# Patient Record
Sex: Female | Born: 1990 | Race: White | Hispanic: No | Marital: Married | Smoking: Never smoker
Health system: Southern US, Academic
[De-identification: ages and names within clinical notes are randomized; demographics above are authoritative.]

## PROBLEM LIST (undated history)

## (undated) DIAGNOSIS — E039 Hypothyroidism, unspecified: Secondary | ICD-10-CM

## (undated) DIAGNOSIS — I10 Essential (primary) hypertension: Secondary | ICD-10-CM

## (undated) DIAGNOSIS — F419 Anxiety disorder, unspecified: Secondary | ICD-10-CM

## (undated) HISTORY — DX: Essential (primary) hypertension: I10

## (undated) HISTORY — DX: Anxiety disorder, unspecified: F41.9

## (undated) HISTORY — PX: HX WISDOM TEETH EXTRACTION: SHX21

## (undated) HISTORY — DX: Hypothyroidism, unspecified: E03.9

---
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
## 2014-05-10 ENCOUNTER — Encounter: Payer: Self-pay | Admitting: Physician Assistant

## 2014-05-10 ENCOUNTER — Ambulatory Visit (INDEPENDENT_AMBULATORY_CARE_PROVIDER_SITE_OTHER): Payer: Managed Care, Other (non HMO) | Admitting: Physician Assistant

## 2014-05-10 VITALS — BP 130/82 | HR 65 | Ht 59.0 in | Wt 105.0 lb

## 2014-05-10 DIAGNOSIS — E039 Hypothyroidism, unspecified: Secondary | ICD-10-CM

## 2014-05-10 DIAGNOSIS — F411 Generalized anxiety disorder: Secondary | ICD-10-CM

## 2014-05-10 DIAGNOSIS — R002 Palpitations: Secondary | ICD-10-CM | POA: Diagnosis not present

## 2014-05-10 DIAGNOSIS — F410 Panic disorder [episodic paroxysmal anxiety] without agoraphobia: Secondary | ICD-10-CM

## 2014-05-10 MED ORDER — LEVOTHYROXINE SODIUM 88 MCG PO TABS: 88.0000 ug | ORAL_TABLET | Freq: Every day | ORAL | Status: DC

## 2014-05-10 MED ORDER — CITALOPRAM HYDROBROMIDE 10 MG PO TABS: 10.0000 mg | ORAL_TABLET | Freq: Every day | ORAL | Status: DC

## 2014-05-10 NOTE — Progress Notes (Signed)
   Subjective:    Patient ID: Heidi Levy, female    DOB: 1991-03-04, 24 y.o.   MRN: [id number]  HPI  Patient is a 24 year old female who presents to the clinic to establish care.  She has a past medical history of hypothyroidism. She has not been on her medication in the last couple weeks. She needs to be restarted.  [name]..No family history on file. .. History   Social History  . Marital Status: Single    Spouse Name: N/A  . Number of Children: N/A  . Years of Education: N/A   Occupational History  . Not on file.   Social History Main Topics  . Smoking status: Never Smoker   . Smokeless tobacco: Not on file  . Alcohol Use: Not on file  . Drug Use: Not on file  . Sexual Activity: Not on file   Other Topics Concern  . Not on file   Social History Narrative  . No narrative on file   Patient does feel very anxious today. She also feels like her anxiety has been increasing since December. She does not note anything that has triggered her anxiety. She's had some of her first panic attacks in the last month. She will experience heart palpitations and weird sensations in her left arm and neck. Her whole body gets tenths. She has been given Xanax to use as needed which does help. She feels like panic attacks are becoming more often and severe.    Review of Systems  All other systems reviewed and are negative.      Objective:   Physical Exam  Constitutional: She is oriented to person, place, and time. She appears well-developed and well-nourished.  HENT:  Head: Normocephalic and atraumatic.  Cardiovascular: Normal rate, regular rhythm and normal heart sounds.   Pulmonary/Chest: Effort normal and breath sounds normal. She has no wheezes.  Neurological: She is alert and oriented to person, place, and time.  Skin: Skin is dry.  Psychiatric:  Very anxious in the exam room. Her face and chest were very red and blotchy. She broke down and cried at one point about her symptoms.           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 Patient currently has Xanax. Discussed abuse potential and to only use as needed. I did go ahead and add Celexa 10 mg at bedtime. Discuss potential side effects and to call if any worsening anxiety or depression. Patient was certainly encouraged to consider counseling and exercise to help control anxiety symptoms.   Palpitations- EKG NSR at 81. No acute changes. Discuss with patient I do not think palpitations are cardiac related. I really think take are due to either increased anxiety or potentially thyroid levels being off.  Hypothyroidism-restarted medication will check in 4-6 weeks.

## 2014-05-10 NOTE — Patient Instructions (Signed)
Generalized Anxiety Disorder Generalized anxiety disorder (GAD) is a mental disorder. It interferes with life functions, including relationships, work, and school. GAD is different from normal anxiety, which everyone experiences at some point in their lives in response to specific life events and activities. Normal anxiety actually helps us prepare for and get through these life events and activities. Normal anxiety goes away after the event or activity is over.  GAD causes anxiety that is not necessarily related to specific events or activities. It also causes excess anxiety in proportion to specific events or activities. The anxiety associated with GAD is also difficult to control. GAD can vary from mild to severe. People with severe GAD can have intense waves of anxiety with physical symptoms (panic attacks).  SYMPTOMS The anxiety and worry associated with GAD are difficult to control. This anxiety and worry are related to many life events and activities and also occur more days than not for 6 months or longer. People with GAD also have three or more of the following symptoms (one or more in children):  Restlessness.   Fatigue.  Difficulty concentrating.   Irritability.  Muscle tension.  Difficulty sleeping or unsatisfying sleep. DIAGNOSIS GAD is diagnosed through an assessment by your health care provider. Your health care provider will ask you questions aboutyour mood,physical symptoms, and events in your life. Your health care provider may ask you about your medical history and use of alcohol or drugs, including prescription medicines. Your health care provider may also do a physical exam and blood tests. Certain medical conditions and the use of certain substances can cause symptoms similar to those associated with GAD. Your health care provider may refer you to a mental health specialist for further evaluation. TREATMENT The following therapies are usually used to treat GAD:    Medication. Antidepressant medication usually is prescribed for long-term daily control. Antianxiety medicines may be added in severe cases, especially when panic attacks occur.   Talk therapy (psychotherapy). Certain types of talk therapy can be helpful in treating GAD by providing support, education, and guidance. A form of talk therapy called cognitive behavioral therapy can teach you healthy ways to think about and react to daily life events and activities.  Stress managementtechniques. These include yoga, meditation, and exercise and can be very helpful when they are practiced regularly. A mental health specialist can help determine which treatment is best for you. Some people see improvement with one therapy. However, other people require a combination of therapies. Document Released: 06/26/2012 Document Revised: 07/16/2013 Document Reviewed: 06/26/2012 Exit[employer] Patient Information 2015 [employer]. This information is not intended to replace advice given to you by your health care provider. Make sure you discuss any questions you have with your health care provider.  

## 2014-05-13 ENCOUNTER — Encounter: Payer: Self-pay | Admitting: *Deleted

## 2014-05-17 ENCOUNTER — Telehealth: Payer: Self-pay | Admitting: *Deleted

## 2014-05-17 DIAGNOSIS — R002 Palpitations: Secondary | ICD-10-CM

## 2014-05-17 NOTE — Telephone Encounter (Signed)
EKG order placed for the 2/26 appt.

## 2014-06-14 ENCOUNTER — Ambulatory Visit: Payer: Managed Care, Other (non HMO) | Admitting: Physician Assistant

## 2014-06-17 ENCOUNTER — Ambulatory Visit (INDEPENDENT_AMBULATORY_CARE_PROVIDER_SITE_OTHER): Payer: Managed Care, Other (non HMO) | Admitting: Physician Assistant

## 2014-06-17 ENCOUNTER — Encounter: Payer: Self-pay | Admitting: Physician Assistant

## 2014-06-17 VITALS — BP 140/90 | HR 84 | Wt 103.0 lb

## 2014-06-17 DIAGNOSIS — Z79899 Other long term (current) drug therapy: Secondary | ICD-10-CM

## 2014-06-17 DIAGNOSIS — R002 Palpitations: Secondary | ICD-10-CM | POA: Diagnosis not present

## 2014-06-17 DIAGNOSIS — E039 Hypothyroidism, unspecified: Secondary | ICD-10-CM

## 2014-06-17 DIAGNOSIS — F411 Generalized anxiety disorder: Secondary | ICD-10-CM

## 2014-06-17 DIAGNOSIS — F410 Panic disorder [episodic paroxysmal anxiety] without agoraphobia: Secondary | ICD-10-CM | POA: Diagnosis not present

## 2014-06-17 DIAGNOSIS — IMO0001 Reserved for inherently not codable concepts without codable children: Secondary | ICD-10-CM

## 2014-06-17 DIAGNOSIS — R030 Elevated blood-pressure reading, without diagnosis of hypertension: Secondary | ICD-10-CM

## 2014-06-17 MED ORDER — CITALOPRAM HYDROBROMIDE 10 MG PO TABS
10.0000 mg | ORAL_TABLET | Freq: Every day | ORAL | Status: DC
Start: 2014-06-17 — End: 2014-07-12

## 2014-06-18 ENCOUNTER — Other Ambulatory Visit: Payer: Self-pay | Admitting: Physician Assistant

## 2014-06-18 LAB — COMPLETE METABOLIC PANEL WITH GFR
ALBUMIN: 4.9 g/dL (ref 3.5–5.2)
ALK PHOS: 36 U/L — AB (ref 39–117)
ALT: 19 U/L (ref 0–35)
AST: 20 U/L (ref 0–37)
BUN: 12 mg/dL (ref 6–23)
CHLORIDE: 103 meq/L (ref 96–112)
CO2: 25 mEq/L (ref 19–32)
Calcium: 9.4 mg/dL (ref 8.4–10.5)
Creat: 0.72 mg/dL (ref 0.50–1.10)
GFR, Est African American: >89 mL/min
GLUCOSE: 89 mg/dL (ref 70–99)
Potassium: 3.9 mEq/L (ref 3.5–5.3)
SODIUM: 138 meq/L (ref 135–145)
TOTAL PROTEIN: 7.4 g/dL (ref 6.0–8.3)
Total Bilirubin: 1.6 mg/dL — ABNORMAL HIGH (ref 0.2–1.2)

## 2014-06-18 LAB — TSH: TSH: 6.508 u[IU]/mL — ABNORMAL HIGH (ref 0.350–4.500)

## 2014-06-18 MED ORDER — LEVOTHYROXINE SODIUM 100 MCG PO TABS: 100.0000 ug | ORAL_TABLET | Freq: Every day | ORAL | Status: DC

## 2014-06-19 DIAGNOSIS — IMO0001 Reserved for inherently not codable concepts without codable children: Secondary | ICD-10-CM | POA: Insufficient documentation

## 2014-06-19 DIAGNOSIS — R030 Elevated blood-pressure reading, without diagnosis of hypertension: Secondary | ICD-10-CM

## 2014-06-19 NOTE — Progress Notes (Signed)
   Subjective:    Patient ID: Heidi Levy, female    DOB: January 22, 1991, 24 y.o.   MRN: [id number]  HPI Patient presents to the clinic for follow-up on Celexa. She reports her anxiety is doing much better. She has not had any panic attacks in the last month. She certainly still has times or she feels anxious certain social situations does put her at a heightened anxiety. Her blood pressure has been elevated up multiple visits. She really feels like this is due to her anxiety. She admits she has not checked her blood pressure outside of work. She denies any chest pains, palpitations, headaches or vision changes. She is exercising fairly regularly. She has not had to use her Xanax in the last month.  Patient is hypothyroid. She has not had her labs checked in a while. She will be needing a refill soon.   Review of Systems  All other systems reviewed and are negative.      Objective:   Physical Exam  Constitutional: She is oriented to person, place, and time. She appears well-developed and well-nourished.  HENT:  Head: Normocephalic and atraumatic.  Cardiovascular: Normal rate, regular rhythm and normal heart sounds.   Pulmonary/Chest: Effort normal and breath sounds normal.  Neurological: She is alert and oriented to person, place, and time.  Skin: Skin is dry.  Psychiatric: She has a normal mood and affect. Her behavior is normal.          Assessment & Plan:  GAD/palptiations/panic attacks- GAD-7 was 7. Certainly decreased from last visit. Patient is at a rather low-dose of Celexa discussed increased. Patient does not want to increase today. She will try to improve anxiety with regular exercise and behavioral techniques. She has not used any Xanax since last visit. She does not need a refill today. Refilled Celexa for 6 months.  Elevated blood pressure-discuss elevated blood pressure with patient today. It has been high at every visit. We have contributed this to anxiety. Patient  admits she has not checked her blood pressure outside of the office. I advised her to start checking her blood pressure outside of the office and give me readings. If blood pressure continues to be elevated we will consider medication. Discuss other causes of elevated blood pressure such as high salt intake. Encouraged her to decrease her salt.   Hypothyroidism- will check labs and adjust accordingly.

## 2014-06-20 ENCOUNTER — Telehealth: Payer: Self-pay | Admitting: *Deleted

## 2014-06-20 ENCOUNTER — Other Ambulatory Visit: Payer: Self-pay | Admitting: Physician Assistant

## 2014-06-20 DIAGNOSIS — I10 Essential (primary) hypertension: Secondary | ICD-10-CM

## 2014-06-20 MED ORDER — LISINOPRIL 10 MG PO TABS
10.0000 mg | ORAL_TABLET | Freq: Every day | ORAL | Status: DC
Start: 2014-06-20 — End: 2014-07-12

## 2014-06-20 NOTE — Telephone Encounter (Signed)
Patient call  stating that she was told to monitor her blood pressure's  and that they are staying in the high 140's / 150's over mid 80's.  Just letting you know so can continue with the next step.

## 2014-06-20 NOTE — Telephone Encounter (Signed)
Sent lisinopril 10mg  daily to pharmacy. Recheck BP in 3-4 weeks in office to make sure BP more controlled.

## 2014-07-12 ENCOUNTER — Encounter: Payer: Self-pay | Admitting: Physician Assistant

## 2014-07-12 ENCOUNTER — Ambulatory Visit (INDEPENDENT_AMBULATORY_CARE_PROVIDER_SITE_OTHER): Payer: Managed Care, Other (non HMO) | Admitting: Physician Assistant

## 2014-07-12 VITALS — BP 127/76 | HR 93 | Ht 59.0 in | Wt 104.0 lb

## 2014-07-12 DIAGNOSIS — I10 Essential (primary) hypertension: Secondary | ICD-10-CM | POA: Diagnosis not present

## 2014-07-12 DIAGNOSIS — R002 Palpitations: Secondary | ICD-10-CM | POA: Diagnosis not present

## 2014-07-12 DIAGNOSIS — E039 Hypothyroidism, unspecified: Secondary | ICD-10-CM

## 2014-07-12 DIAGNOSIS — F411 Generalized anxiety disorder: Secondary | ICD-10-CM

## 2014-07-12 DIAGNOSIS — F410 Panic disorder [episodic paroxysmal anxiety] without agoraphobia: Secondary | ICD-10-CM

## 2014-07-12 MED ORDER — LISINOPRIL 10 MG PO TABS: 10.0000 mg | ORAL_TABLET | Freq: Every day | ORAL | Status: DC

## 2014-07-12 MED ORDER — CITALOPRAM HYDROBROMIDE 20 MG PO TABS
20.0000 mg | ORAL_TABLET | Freq: Every day | ORAL | Status: DC
Start: 2014-07-12 — End: 2014-12-17

## 2014-07-12 NOTE — Patient Instructions (Signed)

## 2014-07-14 NOTE — Progress Notes (Signed)
   Subjective:    Patient ID: Heidi Levy, female    DOB: Nov 01, 1990, 24 y.o.   MRN: [id number]  HPI Pt presents to the clinic for follow up.   Hypothyroidism- she is on medication. Needs rechecked to make sure in normal range.  GAD/panic attacks- she was doing great but has felt some anxiety creep in and felt times of extreme anxiety. Not had to use xanax is a while.   HTN- no CP, palpitations, headaches, vision changes. Taking lisinopril regularly.    Review of Systems  All other systems reviewed and are negative.      Objective:   Physical Exam  Constitutional: She is oriented to person, place, and time. She appears well-developed and well-nourished.  HENT:  Head: Normocephalic and atraumatic.  Cardiovascular: Normal rate, regular rhythm and normal heart sounds.   Pulmonary/Chest: Effort normal and breath sounds normal. She has no wheezes.  Neurological: She is alert and oriented to person, place, and time.  Skin: Skin is dry.  Psychiatric: She has a normal mood and affect. Her behavior is normal.          Assessment & Plan:  Hypothyroidism- will check TSH and adjust accordingly.   HTN- controlled today. Will leave on lisinipril 10mg . If elevation due to anxiety may start to decrease. Keep monitoring if having symptoms of hypotension which was discussed in office. Follow up in 6 months.   GAD/panic attacks- GAD-7 was 9. Increased celexa to 20mg . Follow up in 6 months. Sooner if needed.

## 2014-09-11 ENCOUNTER — Telehealth: Payer: Self-pay

## 2014-09-11 NOTE — Telephone Encounter (Signed)
Patient called and left a message on nurse line asking for a return call.   Returned Call: Left message asking patient to call back.  

## 2014-09-18 ENCOUNTER — Other Ambulatory Visit: Payer: Self-pay | Admitting: Physician Assistant

## 2014-09-23 ENCOUNTER — Ambulatory Visit: Payer: Managed Care, Other (non HMO) | Admitting: Family Medicine

## 2014-11-01 ENCOUNTER — Ambulatory Visit: Payer: Managed Care, Other (non HMO) | Admitting: Physician Assistant

## 2014-11-08 ENCOUNTER — Encounter: Payer: Self-pay | Admitting: Physician Assistant

## 2014-11-08 ENCOUNTER — Ambulatory Visit (INDEPENDENT_AMBULATORY_CARE_PROVIDER_SITE_OTHER): Payer: Managed Care, Other (non HMO) | Admitting: Physician Assistant

## 2014-11-08 VITALS — BP 129/72 | HR 87 | Ht 59.0 in | Wt 100.0 lb

## 2014-11-08 DIAGNOSIS — I10 Essential (primary) hypertension: Secondary | ICD-10-CM | POA: Diagnosis not present

## 2014-11-08 DIAGNOSIS — F411 Generalized anxiety disorder: Secondary | ICD-10-CM

## 2014-11-08 DIAGNOSIS — E039 Hypothyroidism, unspecified: Secondary | ICD-10-CM

## 2014-11-08 MED ORDER — VILAZODONE HCL 40 MG PO TABS: 40.0000 mg | ORAL_TABLET | Freq: Every day | ORAL | Status: DC

## 2014-11-08 NOTE — Patient Instructions (Signed)
1/2 celexa and 1/2 viibryd for 7 days then stop celexa and start 1 full tablet of viibryd.

## 2014-11-08 NOTE — Progress Notes (Signed)
   Subjective:    Patient ID: Heidi Levy, female    DOB: 03-06-1991, 24 y.o.   MRN: [id number]  HPI Pt presents to the clinic for 6 month follow up.   Hypothyroidism- on levothyroxine daily. Needs labs rechecked. No concerns.   GAD-on celexa  daily. Not doing well. orginally had good response. Now anxiety is bad. She worries about everything. No suicidal or homicidal thoughts. No new events to cause anxiety.   HTN- no CP/SOB. Occasional heart racing with anxiety. Doing well on lisinopril.    Review of Systems  All other systems reviewed and are negative.      Objective:   Physical Exam  Constitutional: She is oriented to person, place, and time. She appears well-developed and well-nourished.  HENT:  Head: Normocephalic and atraumatic.  Neck: Normal range of motion. Neck supple. No thyromegaly present.  Cardiovascular: Normal rate, regular rhythm and normal heart sounds.   Pulmonary/Chest: Effort normal and breath sounds normal.  Neurological: She is alert and oriented to person, place, and time.  Skin: Skin is dry.  Psychiatric: Her behavior is normal.  Seemed a little anxious at visit.          Assessment & Plan:  Hypothyroidism- recheck TSH and adjust medication accordingly.   GAD- GAD-7 was 15. PHQ-9 was 5. Will taper off celexa and start viibryd  daily. Coupon given to make cheaper. Discussed side effects. Follow up in 4-6 weeks.   HTN- continue on lisinopril.

## 2014-11-09 LAB — TSH: TSH: 0.811 u[IU]/mL (ref 0.350–4.500)

## 2014-11-12 ENCOUNTER — Other Ambulatory Visit: Payer: Self-pay | Admitting: *Deleted

## 2014-11-12 MED ORDER — LEVOTHYROXINE SODIUM 88 MCG PO TABS: 88.0000 ug | ORAL_TABLET | Freq: Every day | ORAL | Status: DC

## 2014-12-17 ENCOUNTER — Encounter: Payer: Self-pay | Admitting: Physician Assistant

## 2014-12-17 ENCOUNTER — Ambulatory Visit (INDEPENDENT_AMBULATORY_CARE_PROVIDER_SITE_OTHER): Payer: Managed Care, Other (non HMO) | Admitting: Physician Assistant

## 2014-12-17 VITALS — BP 118/76 | HR 92 | Ht 59.0 in | Wt 104.0 lb

## 2014-12-17 DIAGNOSIS — F411 Generalized anxiety disorder: Secondary | ICD-10-CM | POA: Diagnosis not present

## 2014-12-17 DIAGNOSIS — I10 Essential (primary) hypertension: Secondary | ICD-10-CM | POA: Diagnosis not present

## 2014-12-17 DIAGNOSIS — R002 Palpitations: Secondary | ICD-10-CM | POA: Diagnosis not present

## 2014-12-17 DIAGNOSIS — E039 Hypothyroidism, unspecified: Secondary | ICD-10-CM | POA: Diagnosis not present

## 2014-12-17 DIAGNOSIS — F410 Panic disorder [episodic paroxysmal anxiety] without agoraphobia: Secondary | ICD-10-CM

## 2014-12-17 LAB — TSH: TSH: 18.566 u[IU]/mL — ABNORMAL HIGH (ref 0.350–4.500)

## 2014-12-17 MED ORDER — BUSPIRONE HCL 5 MG PO TABS: 5.0000 mg | ORAL_TABLET | Freq: Three times a day (TID) | ORAL | Status: DC

## 2014-12-17 MED ORDER — ESCITALOPRAM OXALATE 10 MG PO TABS: 10.0000 mg | ORAL_TABLET | Freq: Every day | ORAL | Status: DC

## 2014-12-17 NOTE — Progress Notes (Signed)
   Subjective:    Patient ID: Heidi Levy, female    DOB: 07-25-1990, 24 y.o.   MRN: [id number]  HPI Patient is a 24 year old female who presents to the clinic to follow-up on anxiety. She was seen approximately 5 weeks ago and we had started her on Viibryd. This will switch from Celexa. She did not do well on viibryd. She complained of increased anxiety, increased insomnia, and night sweats. She took her last dose yesterday. She certainly has anxiety all day long. She has trouble relaxing and easily irritated. She denies any thoughts of suicide or hurting others. She feels like her anxiety increases because she will occasionally get chest pain and chest tightness with her anxiety. She does not like to take than a but will if she has to.  Patient also has some ongoing thyroid issues. We decreased her levothyroxin at last visit and need to recheck today.   Review of Systems  All other systems reviewed and are negative.      Objective:   Physical Exam  Constitutional: She is oriented to person, place, and time. She appears well-developed and well-nourished.  HENT:  Head: Normocephalic and atraumatic.  Cardiovascular: Normal rate, regular rhythm and normal heart sounds.   Pulmonary/Chest: Effort normal and breath sounds normal.  Neurological: She is alert and oriented to person, place, and time.  Psychiatric: She has a normal mood and affect. Her behavior is normal.          Assessment & Plan:  GAD- GAD-7 was 10. PHQ-9 was 5. After speaking with patient patient seems to have the best results with Celexa but was not working once she had been on for a while. I would like to try Lexapro 10 mg today and use BuSpar 5 mg up to 3 times a day for 10, anxiety relief. Will follow-up in the next 4-6 weeks. If she feels like her anxiety is worsening or change in shoe may follow-up sooner.  Hypothyroidism- will check levels today and adjust accordingly.   HTN- controlled today.

## 2014-12-18 ENCOUNTER — Other Ambulatory Visit: Payer: Self-pay | Admitting: Physician Assistant

## 2014-12-18 MED ORDER — LEVOTHYROXINE SODIUM 100 MCG PO TABS: 100.0000 ug | ORAL_TABLET | Freq: Every day | ORAL | Status: DC

## 2015-01-06 ENCOUNTER — Telehealth: Payer: Self-pay | Admitting: *Deleted

## 2015-01-06 NOTE — Telephone Encounter (Signed)
Pt left vm asking for a referral to Endocrinology since her thyroid levels keep fluctuating so much & her anxiety is so high.

## 2015-01-07 ENCOUNTER — Other Ambulatory Visit: Payer: Self-pay | Admitting: Physician Assistant

## 2015-01-07 DIAGNOSIS — E039 Hypothyroidism, unspecified: Secondary | ICD-10-CM

## 2015-01-07 NOTE — Progress Notes (Signed)
Sent referral 

## 2015-01-07 NOTE — Telephone Encounter (Signed)
Pt notified of referral  

## 2015-01-07 NOTE — Telephone Encounter (Signed)
Sent referral 

## 2015-01-10 ENCOUNTER — Ambulatory Visit: Payer: Managed Care, Other (non HMO) | Admitting: Physician Assistant

## 2015-01-15 ENCOUNTER — Encounter: Payer: Self-pay | Admitting: Endocrinology

## 2015-01-15 ENCOUNTER — Ambulatory Visit (INDEPENDENT_AMBULATORY_CARE_PROVIDER_SITE_OTHER): Payer: Managed Care, Other (non HMO) | Admitting: Endocrinology

## 2015-01-15 VITALS — BP 112/64 | HR 83 | Temp 98.3°F | Ht 59.0 in | Wt 102.0 lb

## 2015-01-15 DIAGNOSIS — E039 Hypothyroidism, unspecified: Secondary | ICD-10-CM | POA: Insufficient documentation

## 2015-01-15 DIAGNOSIS — Z0289 Encounter for other administrative examinations: Secondary | ICD-10-CM

## 2015-01-15 LAB — TSH: TSH: 0.65 u[IU]/mL (ref 0.35–4.50)

## 2015-01-15 NOTE — Progress Notes (Signed)
Subjective:    Patient ID: Heidi Levy, female    DOB: 1990/05/31, 24 y.o.   MRN: [id number]  HPI Pt reports hypothyroidism was dx'ed at age 89.  she has been on prescribed thyroid hormone therapy.  she has never taken kelp or any other type of non-prescribed thyroid product.  she has never had thyroid imaging.  She is not considering a pregnancy.  she has never had thyroid surgery, or XRT to the neck.  He has never been on amiodarone or lithium.  She has slight cold intolerance throughout the body, and assoc anxiety.  She says her dosage requirement has varied from 88-125 mcg/d.  She says TFT have varied widely with minimal adjustments in the dosage.  She has been on 100 mcg/d x a few weeks.  She takes it with water only, and fasting.    Past Medical History  Diagnosis Date  . Hypothyroidism     Past Surgical History  Procedure Laterality Date  . Wisdom tooth extraction  May 2009    Social History   Social History  . Marital Status: Single    Spouse Name: N/A  . Number of Children: N/A  . Years of Education: N/A   Occupational History  . Not on file.   Social History Main Topics  . Smoking status: Never Smoker   . Smokeless tobacco: Not on file  . Alcohol Use: 1.2 - 1.8 oz/week    2-3 Standard drinks or equivalent per week  . Drug Use: No  . Sexual Activity: Yes    Birth Control/ Protection: Pill   Other Topics Concern  . Not on file   Social History Narrative    Current Outpatient Prescriptions on File Prior to Visit  Medication Sig Dispense Refill  . ALPRAZolam (XANAX) 0.5 MG tablet Take 0.5 mg by mouth 2 (two) times daily as needed for anxiety.    . busPIRone (BUSPAR) 5 MG tablet Take 1 tablet (5 mg total) by mouth 3 (three) times daily. 90 tablet 0  . escitalopram (LEXAPRO) 10 MG tablet Take 1 tablet (10 mg total) by mouth daily. 30 tablet 2  . levothyroxine (SYNTHROID, LEVOTHROID) 100 MCG tablet Take 1 tablet (100 mcg total) by mouth daily. 30 tablet 1  .  lisinopril (PRINIVIL,ZESTRIL) 10 MG tablet Take 1 tablet (10 mg total) by mouth daily. 30 tablet 5  . loratadine (CLARITIN) 10 MG tablet Take 10 mg by mouth every other day.    . Norethindrone-Eth Estradiol (ALYACEN 1/35 PO) Take by mouth daily.     No current facility-administered medications on file prior to visit.    Allergies  Allergen Reactions  . Benzoyl Peroxide     Family History  Problem Relation Age of Onset  . Diabetes Father   . Hyperlipidemia Father   . Hypertension Father   . Hypertension Mother   . Stroke Father   . Thyroid disease Father     BP 112/64 mmHg  Pulse 83  Temp(Src) 98.3 F (36.8 C) (Oral)  Ht 4\' 11"  (1.499 m)  Wt 102 lb (46.267 kg)  BMI 20.59 kg/m2  SpO2 97%  Review of Systems denies insomnia, hair loss, muscle cramps, sob, weight gain, constipation, numbness, blurry vision, myalgias, rhinorrhea, easy bruising, and syncope.  She has monthly bleeding on oral contraceptive.  She has dry skin.      Objective:   Physical Exam VS: see vs page GEN: no distress HEAD: head: no deformity eyes: no periorbital swelling, no proptosis external  nose and ears are normal mouth: no lesion seen NECK: supple, thyroid is not enlarged CHEST WALL: no deformity LUNGS:  Clear to auscultation CV: reg rate and rhythm, no murmur. ABD: abdomen is soft, nontender.  no hepatosplenomegaly.  not distended.  no hernia.   MUSCULOSKELETAL: muscle bulk and strength are grossly normal.  no obvious joint swelling.  gait is normal and steady EXTEMITIES: no deformity.  no edema PULSES: dorsalis pedis intact bilat.   NEURO:  cn 2-12 grossly intact.   readily moves all 4's.  sensation is intact to touch on all 4's.   SKIN:  Normal texture and temperature.  No rash or suspicious lesion is visible.   NODES:  None palpable at the neck PSYCH: alert, well-oriented.  Does not appear anxious nor depressed.  Lab Results  Component Value Date   TSH 0.65 01/15/2015   I have  reviewed outside records, and summarized: Pt was noted to have labile TSH, and referred here.     Assessment & Plan:  Hypothyroidism: well-replaced.  However, we'll continue to follow, as TSH has been labile. 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    Patient is advised the following: Patient Instructions  blood tests are requested for you today.  We'll let you know about the results. If at all possible, we'll try to stay at 100 for now. I would be happy to see you back here as necessary In view of your medical condition, you should avoid pregnancy until we have decided it is safe.       Levothyroxine injection What is this medicine? LEVOTHYROXINE (lee voe thye ROX een) is a thyroid hormone. This medicine can improve symptoms of thyroid deficiency such as slow speech, lack of energy, weight gain, hair loss, dry skin, and feeling cold. It also helps to treat goiter (an enlarged thyroid gland). This medicine may be used for other purposes; ask your health care provider or pharmacist if you have questions. What should I tell my health care provider before I take this medicine? They need to know if you have any of these conditions: -angina -diabetes -dieting or on a weight loss program -fertility problems -heart disease -high levels of thyroid hormone -pituitary gland problem -previous heart attack -an unusual or allergic reaction to levothyroxine, thyroid hormones, other medicines, foods, dyes, or preservatives -pregnant or trying to get pregnant -breast-feeding How should I use this medicine? This medicine is for injection into a muscle or into a vein. It is given by a health care professional in a hospital or clinic setting. Contact your pediatrician regarding the use of this medicine in children. While this drug may be prescribed for children and infants as young as a few days of age for selected conditions, precautions do  apply. Overdosage: If you think you have taken too much of this medicine contact a poison control center or emergency room at once. NOTE: This medicine is only for you. Do not share this medicine with others. What if I miss a dose? This does not apply. What may interact with this medicine? -amiodarone -carbamazepine -digoxin -female hormones, including contraceptive or birth control pills -ketamine -medicines for colds and breathing difficulties -medicines for diabetes -medicines for mental depression -medicines or herbals used to decrease weight or appetite -phenobarbital or other barbiturate medications -phenytoin -prednisone or other corticosteroids -rifabutin -rifampin -soy isoflavones -theophylline -warfarin This list may not describe all possible interactions. Give your health care provider a list of  all the medicines, herbs, non-prescription drugs, or dietary supplements you use. Also tell them if you smoke, drink alcohol, or use illegal drugs. Some items may interact with your medicine. What should I watch for while using this medicine? You will need regular exams and occasional blood tests to check the response to treatment. If you are receiving this medicine for an underactive thyroid, it may be several weeks before you notice an improvement. Check with your doctor or health care professional if your symptoms do not improve. It may be necessary for you to take this medicine for the rest of your life. Do not stop using this medicine unless your doctor or health care professional advises you to. This medicine can affect blood sugar levels. If you have diabetes, check your blood sugar as directed. You may lose some of your hair when you first start treatment. With time, this usually corrects itself. If you are going to have surgery, tell your doctor or health care professional that you are taking this medicine. What side effects may I notice from receiving this medicine? Side  effects that you should report to your doctor or health care professional as soon as possible: -difficulty breathing, wheezing, or shortness of breath -chest pain -excessive sweating or intolerance to heat -fast or irregular heartbeat -nervousness -skin rash or hives -swelling of ankles, feet, or legs -tremors Side effects that usually do not require medical attention (report to your doctor or health care professional if they continue or are bothersome): -changes in appetite -changes in menstrual periods -diarrhea -hair loss -headache -trouble sleeping -weight loss This list may not describe all possible side effects. Call your doctor for medical advice about side effects. You may report side effects to FDA at 1-800-FDA-1088. Where should I keep my medicine? This does not apply. This medicine will be given to you in a hospital or health clinic setting. You will not store this medicine at home. NOTE: This sheet is a summary. It may not cover all possible information. If you have questions about this medicine, talk to your doctor, pharmacist, or health care provider.    2016, [employer]. (2011-03-25 12:51:00)

## 2015-01-15 NOTE — Patient Instructions (Addendum)
blood tests are requested for you today.  We'll let you know about the results. If at all possible, we'll try to stay at 100 for now. I would be happy to see you back here as necessary In view of your medical condition, you should avoid pregnancy until we have decided it is safe.       Levothyroxine injection What is this medicine? LEVOTHYROXINE (lee voe thye ROX een) is a thyroid hormone. This medicine can improve symptoms of thyroid deficiency such as slow speech, lack of energy, weight gain, hair loss, dry skin, and feeling cold. It also helps to treat goiter (an enlarged thyroid gland). This medicine may be used for other purposes; ask your health care provider or pharmacist if you have questions. What should I tell my health care provider before I take this medicine? They need to know if you have any of these conditions: -angina -diabetes -dieting or on a weight loss program -fertility problems -heart disease -high levels of thyroid hormone -pituitary gland problem -previous heart attack -an unusual or allergic reaction to levothyroxine, thyroid hormones, other medicines, foods, dyes, or preservatives -pregnant or trying to get pregnant -breast-feeding How should I use this medicine? This medicine is for injection into a muscle or into a vein. It is given by a health care professional in a hospital or clinic setting. Contact your pediatrician regarding the use of this medicine in children. While this drug may be prescribed for children and infants as young as a few days of age for selected conditions, precautions do apply. Overdosage: If you think you have taken too much of this medicine contact a poison control center or emergency room at once. NOTE: This medicine is only for you. Do not share this medicine with others. What if I miss a dose? This does not apply. What may interact with this medicine? -amiodarone -carbamazepine -digoxin -female hormones, including  contraceptive or birth control pills -ketamine -medicines for colds and breathing difficulties -medicines for diabetes -medicines for mental depression -medicines or herbals used to decrease weight or appetite -phenobarbital or other barbiturate medications -phenytoin -prednisone or other corticosteroids -rifabutin -rifampin -soy isoflavones -theophylline -warfarin This list may not describe all possible interactions. Give your health care provider a list of all the medicines, herbs, non-prescription drugs, or dietary supplements you use. Also tell them if you smoke, drink alcohol, or use illegal drugs. Some items may interact with your medicine. What should I watch for while using this medicine? You will need regular exams and occasional blood tests to check the response to treatment. If you are receiving this medicine for an underactive thyroid, it may be several weeks before you notice an improvement. Check with your doctor or health care professional if your symptoms do not improve. It may be necessary for you to take this medicine for the rest of your life. Do not stop using this medicine unless your doctor or health care professional advises you to. This medicine can affect blood sugar levels. If you have diabetes, check your blood sugar as directed. You may lose some of your hair when you first start treatment. With time, this usually corrects itself. If you are going to have surgery, tell your doctor or health care professional that you are taking this medicine. What side effects may I notice from receiving this medicine? Side effects that you should report to your doctor or health care professional as soon as possible: -difficulty breathing, wheezing, or shortness of breath -chest pain -excessive sweating or  intolerance to heat -fast or irregular heartbeat -nervousness -skin rash or hives -swelling of ankles, feet, or legs -tremors Side effects that usually do not require  medical attention (report to your doctor or health care professional if they continue or are bothersome): -changes in appetite -changes in menstrual periods -diarrhea -hair loss -headache -trouble sleeping -weight loss This list may not describe all possible side effects. Call your doctor for medical advice about side effects. You may report side effects to FDA at 1-800-FDA-1088. Where should I keep my medicine? This does not apply. This medicine will be given to you in a hospital or health clinic setting. You will not store this medicine at home. NOTE: This sheet is a summary. It may not cover all possible information. If you have questions about this medicine, talk to your doctor, pharmacist, or health care provider.    2016, [employer] Standard. (2011-03-25 12:51:00)

## 2015-01-16 ENCOUNTER — Telehealth: Payer: Self-pay | Admitting: Endocrinology

## 2015-01-16 NOTE — Telephone Encounter (Signed)
please call patient: Please continue the same thyroid medication. However, i would be happy to recheck this if you want. Do you want to recheck in 1-2 months?

## 2015-01-17 NOTE — Telephone Encounter (Signed)
Left voicemail advising of note below. Requested call back from the patient to schedule lab work if she would like.

## 2015-01-18 ENCOUNTER — Other Ambulatory Visit: Payer: Self-pay | Admitting: Physician Assistant

## 2015-01-21 ENCOUNTER — Other Ambulatory Visit: Payer: Self-pay | Admitting: Physician Assistant

## 2015-02-04 ENCOUNTER — Other Ambulatory Visit: Payer: Self-pay | Admitting: Physician Assistant

## 2015-03-03 ENCOUNTER — Other Ambulatory Visit: Payer: Self-pay | Admitting: Physician Assistant

## 2015-03-11 ENCOUNTER — Other Ambulatory Visit: Payer: Self-pay | Admitting: Physician Assistant

## 2015-03-23 ENCOUNTER — Other Ambulatory Visit: Payer: Self-pay | Admitting: Physician Assistant

## 2015-03-25 ENCOUNTER — Encounter: Payer: Self-pay | Admitting: Physician Assistant

## 2015-04-16 ENCOUNTER — Other Ambulatory Visit: Payer: Self-pay | Admitting: Physician Assistant

## 2015-04-16 ENCOUNTER — Encounter: Payer: Self-pay | Admitting: Physician Assistant

## 2015-04-16 MED ORDER — ESCITALOPRAM OXALATE 20 MG PO TABS: 20.0000 mg | ORAL_TABLET | Freq: Every day | ORAL | Status: DC

## 2015-05-26 ENCOUNTER — Ambulatory Visit (INDEPENDENT_AMBULATORY_CARE_PROVIDER_SITE_OTHER): Payer: Managed Care, Other (non HMO) | Admitting: Physician Assistant

## 2015-05-26 ENCOUNTER — Encounter: Payer: Self-pay | Admitting: Physician Assistant

## 2015-05-26 VITALS — BP 122/73 | HR 82 | Ht 59.0 in | Wt 99.0 lb

## 2015-05-26 DIAGNOSIS — E039 Hypothyroidism, unspecified: Secondary | ICD-10-CM

## 2015-05-26 DIAGNOSIS — R300 Dysuria: Secondary | ICD-10-CM

## 2015-05-26 DIAGNOSIS — F410 Panic disorder [episodic paroxysmal anxiety] without agoraphobia: Secondary | ICD-10-CM

## 2015-05-26 DIAGNOSIS — R3915 Urgency of urination: Secondary | ICD-10-CM | POA: Diagnosis not present

## 2015-05-26 DIAGNOSIS — F411 Generalized anxiety disorder: Secondary | ICD-10-CM

## 2015-05-26 DIAGNOSIS — N3001 Acute cystitis with hematuria: Secondary | ICD-10-CM

## 2015-05-26 DIAGNOSIS — I10 Essential (primary) hypertension: Secondary | ICD-10-CM

## 2015-05-26 LAB — POCT URINALYSIS DIPSTICK
Bilirubin, UA: NEGATIVE
GLUCOSE UA: NEGATIVE
Ketones, UA: NEGATIVE
NITRITE UA: POSITIVE
PROTEIN UA: NEGATIVE
Spec Grav, UA: 1.025
UROBILINOGEN UA: 0.2
pH, UA: 7

## 2015-05-26 LAB — TSH: TSH: 0.83 m[IU]/L

## 2015-05-26 MED ORDER — CIPROFLOXACIN HCL 500 MG PO TABS: 500.0000 mg | ORAL_TABLET | Freq: Two times a day (BID) | ORAL | Status: DC

## 2015-05-26 NOTE — Progress Notes (Signed)
   Subjective:    Patient ID: Heidi Levy, female    DOB: May 02, 1990, 25 y.o.   MRN: [id number]  HPI Pt is a 25 yo female who presents to the clinic with dysuria, frequency, urgency with new low back pain for last week. No fever, chills, n/v/d. She has not tried anything to make better.   She does need levothyroxine refills. She went to endocrinology once but he released her back to PCP.   She is on lexapro daily. Rarely takes xanax. She feels "ok" but still struggles with daily anxious feelings. She is not having as many "attacks".     Review of Systems See HPI>     Objective:   Physical Exam  Constitutional: She is oriented to person, place, and time. She appears well-developed and well-nourished.  HENT:  Head: Normocephalic and atraumatic.  Eyes: Conjunctivae are normal.  Neck: Normal range of motion. Neck supple. No thyromegaly present.  Cardiovascular: Normal rate, regular rhythm and normal heart sounds.   Pulmonary/Chest: Effort normal and breath sounds normal.  No CVA tenderness.   Abdominal:  Tenderness over suprapubic area to palpation.   Neurological: She is alert and oriented to person, place, and time.  Psychiatric: She has a normal mood and affect. Her behavior is normal.          Assessment & Plan:  Acute cystitis- .. Results for orders placed or performed in visit on 05/26/15  POCT urinalysis dipstick  Result Value Ref Range   Color, UA yellow    Clarity, UA turbid    Glucose, UA neg    Bilirubin, UA neg    Ketones, UA neg    Spec Grav, UA 1.025    Blood, UA small    pH, UA 7.0    Protein, UA neg    Urobilinogen, UA 0.2    Nitrite, UA pos    Leukocytes, UA moderate (2+) (A) Negative   Will culture. Given cipro for 7 days due to duration of symptoms and recent left back pain.  Follow up as needed or if symptoms not improving.   HTN- doing well after 2nd recheck. Continue lisinopril.   Hypothyroidism- will check TSH today and see if any  adjustments need to be made.   Panic attack/GAD- does not need refills today. Discussed switching medication if continues to not be controlled. Encouraged exercise, mediation, and counseling. Pt does not want referral for counseling at this time.

## 2015-05-26 NOTE — Patient Instructions (Signed)

## 2015-05-27 ENCOUNTER — Other Ambulatory Visit: Payer: Self-pay

## 2015-05-27 ENCOUNTER — Other Ambulatory Visit: Payer: Self-pay | Admitting: Physician Assistant

## 2015-05-27 MED ORDER — LEVOTHYROXINE SODIUM 100 MCG PO TABS: 100.0000 ug | ORAL_TABLET | Freq: Every day | ORAL | Status: DC

## 2015-05-29 LAB — URINE CULTURE

## 2015-06-13 ENCOUNTER — Other Ambulatory Visit: Payer: Self-pay | Admitting: Physician Assistant

## 2015-07-30 ENCOUNTER — Other Ambulatory Visit: Payer: Self-pay | Admitting: Physician Assistant

## 2015-07-31 ENCOUNTER — Ambulatory Visit (INDEPENDENT_AMBULATORY_CARE_PROVIDER_SITE_OTHER): Payer: Managed Care, Other (non HMO) | Admitting: Physician Assistant

## 2015-07-31 VITALS — BP 107/70 | HR 73 | Wt 100.0 lb

## 2015-07-31 DIAGNOSIS — Z111 Encounter for screening for respiratory tuberculosis: Secondary | ICD-10-CM

## 2015-07-31 DIAGNOSIS — Z23 Encounter for immunization: Secondary | ICD-10-CM | POA: Diagnosis not present

## 2015-07-31 NOTE — Progress Notes (Signed)
Patient is here for a PPD placement and a tetanus vaccine.   Patient tolerated injections well without complications.

## 2015-08-03 ENCOUNTER — Telehealth: Payer: Self-pay | Admitting: Emergency Medicine

## 2015-08-03 ENCOUNTER — Emergency Department
Admission: EM | Admit: 2015-08-03 | Discharge: 2015-08-03 | Disposition: A | Discharge status: Home / Self Care | Payer: Self-pay | Source: Home / Self Care

## 2015-08-03 NOTE — ED Notes (Signed)
Pt here for PPD reading.  No induration. Neg tb read.

## 2015-08-22 ENCOUNTER — Other Ambulatory Visit: Payer: Self-pay | Admitting: Physician Assistant

## 2015-09-20 ENCOUNTER — Encounter: Payer: Self-pay | Admitting: Physician Assistant

## 2015-09-20 ENCOUNTER — Other Ambulatory Visit: Payer: Self-pay | Admitting: Physician Assistant

## 2015-09-22 ENCOUNTER — Other Ambulatory Visit: Payer: Self-pay | Admitting: Physician Assistant

## 2015-09-22 MED ORDER — ALPRAZOLAM 0.5 MG PO TABS: 0.5000 mg | ORAL_TABLET | Freq: Two times a day (BID) | ORAL | Status: DC | PRN

## 2015-10-11 ENCOUNTER — Other Ambulatory Visit: Payer: Self-pay | Admitting: Physician Assistant

## 2015-11-25 ENCOUNTER — Other Ambulatory Visit: Payer: Self-pay | Admitting: Physician Assistant

## 2015-12-10 ENCOUNTER — Other Ambulatory Visit: Payer: Self-pay | Admitting: Physician Assistant

## 2015-12-19 ENCOUNTER — Encounter: Payer: Self-pay | Admitting: Physician Assistant

## 2015-12-19 ENCOUNTER — Ambulatory Visit (INDEPENDENT_AMBULATORY_CARE_PROVIDER_SITE_OTHER): Payer: BLUE CROSS/BLUE SHIELD | Admitting: Physician Assistant

## 2015-12-19 VITALS — BP 111/63 | HR 75 | Ht 59.0 in | Wt 101.0 lb

## 2015-12-19 DIAGNOSIS — F411 Generalized anxiety disorder: Secondary | ICD-10-CM | POA: Diagnosis not present

## 2015-12-19 DIAGNOSIS — F410 Panic disorder [episodic paroxysmal anxiety] without agoraphobia: Secondary | ICD-10-CM | POA: Diagnosis not present

## 2015-12-19 DIAGNOSIS — Z23 Encounter for immunization: Secondary | ICD-10-CM | POA: Diagnosis not present

## 2015-12-19 DIAGNOSIS — E039 Hypothyroidism, unspecified: Secondary | ICD-10-CM

## 2015-12-19 MED ORDER — LISINOPRIL 10 MG PO TABS: 10.0000 mg | ORAL_TABLET | Freq: Every day | ORAL | 5 refills | Status: DC

## 2015-12-19 NOTE — Patient Instructions (Signed)
Heidi QueryJulie Whit

## 2015-12-19 NOTE — Progress Notes (Signed)
   Subjective:    Patient ID: Heidi Levy, female    DOB: [birth year], 25 y.o.   MRN: [id number]  HPI Pt is a [age] yo female who presents to the clinic for follow up.   She is doing well with anxiety and panic attacks. She has not had to use xanax in a few months. She feels controlled on current regimen.   Hypothyroidism doing well on synthroid. Not checked in 6 months.   BP elevated in the past. Not taking lisinopril. No cp, palpitations, SOB, headaches.    Review of Systems See HPI.     Objective:   Physical Exam  Constitutional: She is oriented to person, place, and time. She appears well-developed and well-nourished.  HENT:  Head: Normocephalic and atraumatic.  Neck: Normal range of motion. Neck supple. No thyromegaly present.  Cardiovascular: Normal rate, regular rhythm and normal heart sounds.   Pulmonary/Chest: Effort normal and breath sounds normal.  Neurological: She is alert and oriented to person, place, and time.  Psychiatric: She has a normal mood and affect. Her behavior is normal.          Assessment & Plan:  GAD/panic attacks- GAD-7 was 5. Refilled lexapro. Encouraged counseling. Discussed Heidi Levy.   HTN- BP low today without lisinopril. Stop and monitor BP. Printed of rx if starts to climb back up. I believe elevated BP was likely due to anxiety when it was not controlled.   Hypothyroidism- will recheck levels and adjust accordingly.   flus shot given today without complications.

## 2015-12-20 LAB — TSH: TSH: 2.62 m[IU]/L

## 2015-12-22 ENCOUNTER — Other Ambulatory Visit: Payer: Self-pay | Admitting: *Deleted

## 2015-12-22 MED ORDER — LEVOTHYROXINE SODIUM 100 MCG PO TABS: 100.0000 ug | ORAL_TABLET | Freq: Every day | ORAL | 3 refills | Status: DC

## 2016-01-09 ENCOUNTER — Other Ambulatory Visit: Payer: Self-pay | Admitting: Physician Assistant

## 2016-01-29 ENCOUNTER — Other Ambulatory Visit: Payer: Self-pay

## 2016-01-29 MED ORDER — LISINOPRIL 10 MG PO TABS: 10.0000 mg | ORAL_TABLET | Freq: Every day | ORAL | 1 refills | Status: DC

## 2016-04-18 ENCOUNTER — Other Ambulatory Visit: Payer: Self-pay | Admitting: Physician Assistant

## 2017-01-17 ENCOUNTER — Other Ambulatory Visit: Payer: Self-pay | Admitting: Physician Assistant

## 2017-02-18 ENCOUNTER — Other Ambulatory Visit: Payer: Self-pay | Admitting: Physician Assistant

## 2017-03-23 ENCOUNTER — Other Ambulatory Visit: Payer: Self-pay | Admitting: Physician Assistant

## 2017-04-22 ENCOUNTER — Ambulatory Visit: Payer: BLUE CROSS/BLUE SHIELD | Admitting: Physician Assistant

## 2017-04-22 ENCOUNTER — Encounter: Payer: Self-pay | Admitting: Physician Assistant

## 2017-04-22 VITALS — BP 117/63 | HR 70 | Ht 59.0 in | Wt 111.0 lb

## 2017-04-22 DIAGNOSIS — E039 Hypothyroidism, unspecified: Secondary | ICD-10-CM

## 2017-04-22 DIAGNOSIS — F410 Panic disorder [episodic paroxysmal anxiety] without agoraphobia: Secondary | ICD-10-CM | POA: Diagnosis not present

## 2017-04-22 DIAGNOSIS — Z23 Encounter for immunization: Secondary | ICD-10-CM | POA: Diagnosis not present

## 2017-04-22 DIAGNOSIS — F411 Generalized anxiety disorder: Secondary | ICD-10-CM

## 2017-04-22 DIAGNOSIS — I10 Essential (primary) hypertension: Secondary | ICD-10-CM | POA: Diagnosis not present

## 2017-04-22 DIAGNOSIS — Z13220 Encounter for screening for lipoid disorders: Secondary | ICD-10-CM | POA: Diagnosis not present

## 2017-04-22 LAB — COMPLETE METABOLIC PANEL WITH GFR
AG Ratio: 2 (calc) (ref 1.0–2.5)
ALKALINE PHOSPHATASE (APISO): 40 U/L (ref 33–115)
ALT: 10 U/L (ref 6–29)
AST: 18 U/L (ref 10–30)
Albumin: 4.7 g/dL (ref 3.6–5.1)
BILIRUBIN TOTAL: 0.9 mg/dL (ref 0.2–1.2)
BUN: 10 mg/dL (ref 7–25)
CHLORIDE: 105 mmol/L (ref 98–110)
CO2: 26 mmol/L (ref 20–32)
Calcium: 9.1 mg/dL (ref 8.6–10.2)
Creat: 0.79 mg/dL (ref 0.50–1.10)
GFR, Est African American: 117 mL/min/{1.73_m2} (ref >=60)
GFR, Est Non African American: 101 mL/min/{1.73_m2} (ref >=60)
GLUCOSE: 83 mg/dL (ref 65–99)
Globulin: 2.3 g/dL (calc) (ref 1.9–3.7)
Potassium: 4.1 mmol/L (ref 3.5–5.3)
Sodium: 139 mmol/L (ref 135–146)
TOTAL PROTEIN: 7 g/dL (ref 6.1–8.1)

## 2017-04-22 LAB — TSH: TSH: 141.1 m[IU]/L — AB

## 2017-04-22 LAB — LIPID PANEL W/REFLEX DIRECT LDL
CHOLESTEROL: 205 mg/dL — AB (ref <200)
HDL: 82 mg/dL (ref >50)
LDL CHOLESTEROL (CALC): 109 mg/dL — AB
Non-HDL Cholesterol (Calc): 123 mg/dL (calc) (ref <130)
TRIGLYCERIDES: 48 mg/dL (ref <150)
Total CHOL/HDL Ratio: 2.5 (calc) (ref <5.0)

## 2017-04-22 MED ORDER — HYDROXYZINE PAMOATE 25 MG PO CAPS: 12.5000 mg | ORAL_CAPSULE | Freq: Every day | ORAL | 11 refills | Status: DC

## 2017-04-22 MED ORDER — ESCITALOPRAM OXALATE 20 MG PO TABS: 20.0000 mg | ORAL_TABLET | Freq: Every day | ORAL | 1 refills | Status: DC

## 2017-04-22 NOTE — Progress Notes (Signed)
Subjective:    Patient ID: Heidi Levy, female    DOB: 12/29/1990, 27 y.o.   MRN: [id number]  HPI  Patient is a 27 year old female with hypothyroidism, generalized anxiety disorder who presents to the clinic for medication refill and follow-up.  Has been over a year since she was last seen.    Patient is doing great today.  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 Her anxiety is much controlled after going to counseling to learning coping strategies. She is on lexapro and vistaril as needed. It is working great.   Hypothyroidism- needs labs. Taking levothyroxine daily. No problems or concerns.   .. Active Ambulatory Problems    Diagnosis Date Noted  . Panic attack 05/10/2014  . GAD (generalized anxiety disorder) 05/10/2014  . Palpitations 05/10/2014  . Elevated blood pressure 06/19/2014  . Essential hypertension, benign 06/20/2014  . Hypothyroidism 01/15/2015   Resolved Ambulatory Problems    Diagnosis Date Noted  . No Resolved Ambulatory Problems   Past Medical History:  Diagnosis Date  . Hypothyroidism       Review of Systems  All other systems reviewed and are negative.      Objective:   Physical Exam  Constitutional: She is oriented to person, place, and time. She appears well-developed and well-nourished.  HENT:  Head: Normocephalic and atraumatic.  Eyes: Conjunctivae are normal.  Neck: Normal range of motion. Neck supple. No thyromegaly present.  Cardiovascular: Normal rate, regular rhythm and normal heart sounds.  Pulmonary/Chest: Effort normal and breath sounds normal.  Neurological: She is alert and oriented to person, place, and time.  Psychiatric: She has a normal mood and affect. Her behavior is normal.          Assessment & Plan:  [name].[name].Heidi Levy was seen today for hypothyroidism, hypertension and anxiety.  Diagnoses and all orders for this visit:  Acquired hypothyroidism -     TSH  Influenza vaccine  needed -     Flu Vaccine QUAD 6+ mos PF IM (Fluarix Quad PF)  Panic attack -     hydrOXYzine (VISTARIL) 25 MG capsule; Take 1 capsule (25 mg total) by mouth daily. -     escitalopram (LEXAPRO) 20 MG tablet; Take 1 tablet (20 mg total) by mouth daily.  GAD (generalized anxiety disorder) -     hydrOXYzine (VISTARIL) 25 MG capsule; Take 1 capsule (25 mg total) by mouth daily. -     escitalopram (LEXAPRO) 20 MG tablet; Take 1 tablet (20 mg total) by mouth daily.  Screening for lipid disorders -     Lipid Panel w/reflex Direct LDL  Essential hypertension, benign -     COMPLETE METABOLIC PANEL WITH GFR   ... [facility] 04/22/2017 12/19/2015  Decreased Interest 0 1  Down, Depressed, Hopeless 1 1  PHQ - 2 Score 1 2  Altered sleeping 0 2  Tired, decreased energy 1 1  Change in appetite 0 0  Feeling bad or failure about yourself  0 1  Trouble concentrating 1 0  Moving slowly or fidgety/restless 0 0  Suicidal thoughts 0 0  PHQ-9 Score 3 6  Difficult doing work/chores Not difficult at all -   .. GAD 7 : Generalized Anxiety Score 04/22/2017 12/19/2015 12/19/2015  Nervous, Anxious, on Edge 0 2 2  Control/stop worrying 0 1 1  Worry too much - different things 1 1 1   Trouble relaxing 0 0 0  Restless 0 0 0  Easily annoyed  or irritable 0 1 0  Afraid - awful might happen 0 0 0  Total GAD 7 Score 1 5 4   Anxiety Difficulty Not difficult at all Somewhat difficult Somewhat difficult    TSH ordered. Will adjust accordingly.   Refilled lexapro for 1 year.   Fasting labs ordered.

## 2017-04-25 ENCOUNTER — Other Ambulatory Visit: Payer: Self-pay | Admitting: Physician Assistant

## 2017-04-25 MED ORDER — LEVOTHYROXINE SODIUM 125 MCG PO TABS: 125.0000 ug | ORAL_TABLET | Freq: Every day | ORAL | 1 refills | Status: DC

## 2017-04-29 ENCOUNTER — Encounter: Payer: Self-pay | Admitting: Physician Assistant

## 2017-05-02 ENCOUNTER — Telehealth: Payer: Self-pay

## 2017-05-02 DIAGNOSIS — R6252 Short stature (child): Secondary | ICD-10-CM

## 2017-05-02 DIAGNOSIS — E038 Other specified hypothyroidism: Secondary | ICD-10-CM

## 2017-05-02 NOTE — Telephone Encounter (Signed)
Endocrinologist suggested this to rule out turners syndrome.

## 2017-05-02 NOTE — Telephone Encounter (Signed)
I advised patient of recent labs. She is not sure why she would need a pelvic ultrasound.

## 2017-05-03 NOTE — Telephone Encounter (Signed)
I will be happy to order. What is the diagnosis?

## 2017-05-03 NOTE — Telephone Encounter (Signed)
Short stature  Hypothyroidism  Thanks.

## 2017-05-03 NOTE — Telephone Encounter (Signed)
Ok to order pelvic/transvaginal u/s

## 2017-05-03 NOTE — Telephone Encounter (Signed)
Patient agreed to have a ultrasound.

## 2017-05-03 NOTE — Telephone Encounter (Signed)
US ordered. Patient is aware.

## 2017-05-06 ENCOUNTER — Ambulatory Visit (INDEPENDENT_AMBULATORY_CARE_PROVIDER_SITE_OTHER): Payer: BLUE CROSS/BLUE SHIELD

## 2017-05-06 DIAGNOSIS — R6252 Short stature (child): Secondary | ICD-10-CM | POA: Diagnosis not present

## 2017-05-06 DIAGNOSIS — E039 Hypothyroidism, unspecified: Secondary | ICD-10-CM

## 2017-05-06 DIAGNOSIS — E038 Other specified hypothyroidism: Secondary | ICD-10-CM

## 2017-05-06 NOTE — Progress Notes (Signed)
Great news normal ultrasound.   Can we send report to endocrinologist sam ellison.

## 2017-05-09 ENCOUNTER — Telehealth: Payer: Self-pay | Admitting: Emergency Medicine

## 2017-05-09 NOTE — Telephone Encounter (Signed)
Spoke with patient and told her the pelvic US was normal. She does not need this information sent to Romero BellingSean Ellison, MD, because she does not intend to return to him for care. She wonders if there is any other follow up necessary/indicated? ------

## 2017-05-09 NOTE — Telephone Encounter (Signed)
Ok for now no. We just need to get this TSH in normal range. We made med adjustment and will recheck in 4 weeks.

## 2017-05-16 LAB — HM PAP SMEAR: HM Pap smear: NEGATIVE

## 2017-06-03 ENCOUNTER — Other Ambulatory Visit: Payer: Self-pay

## 2017-06-03 DIAGNOSIS — E038 Other specified hypothyroidism: Secondary | ICD-10-CM

## 2017-06-04 LAB — TSH: TSH: 1.4 mIU/L

## 2017-06-05 NOTE — Progress Notes (Signed)
Call pt: much better. In  Normal range. How do you feel? Ok to refill for 3 months and recheck TSH in 3months.

## 2017-06-07 ENCOUNTER — Other Ambulatory Visit: Payer: Self-pay | Admitting: *Deleted

## 2017-06-07 MED ORDER — LEVOTHYROXINE SODIUM 125 MCG PO TABS: 125.0000 ug | ORAL_TABLET | Freq: Every day | ORAL | 2 refills | Status: DC

## 2017-07-03 ENCOUNTER — Other Ambulatory Visit: Payer: Self-pay | Admitting: Physician Assistant

## 2017-10-20 DIAGNOSIS — H10413 Chronic giant papillary conjunctivitis, bilateral: Secondary | ICD-10-CM | POA: Diagnosis not present

## 2017-10-31 DIAGNOSIS — H10413 Chronic giant papillary conjunctivitis, bilateral: Secondary | ICD-10-CM | POA: Diagnosis not present

## 2018-01-01 ENCOUNTER — Other Ambulatory Visit: Payer: Self-pay | Admitting: Physician Assistant

## 2018-01-31 DIAGNOSIS — Z3009 Encounter for other general counseling and advice on contraception: Secondary | ICD-10-CM | POA: Diagnosis not present

## 2018-02-02 DIAGNOSIS — Z30430 Encounter for insertion of intrauterine contraceptive device: Secondary | ICD-10-CM | POA: Diagnosis not present

## 2018-03-22 DIAGNOSIS — Z30431 Encounter for routine checking of intrauterine contraceptive device: Secondary | ICD-10-CM | POA: Diagnosis not present

## 2018-04-25 ENCOUNTER — Other Ambulatory Visit: Payer: Self-pay | Admitting: Physician Assistant

## 2018-04-25 DIAGNOSIS — F410 Panic disorder [episodic paroxysmal anxiety] without agoraphobia: Secondary | ICD-10-CM

## 2018-04-25 DIAGNOSIS — F411 Generalized anxiety disorder: Secondary | ICD-10-CM

## 2018-05-09 ENCOUNTER — Other Ambulatory Visit: Payer: Self-pay | Admitting: Physician Assistant

## 2018-05-09 DIAGNOSIS — F411 Generalized anxiety disorder: Secondary | ICD-10-CM

## 2018-05-09 DIAGNOSIS — F410 Panic disorder [episodic paroxysmal anxiety] without agoraphobia: Secondary | ICD-10-CM

## 2018-05-11 ENCOUNTER — Other Ambulatory Visit: Payer: Self-pay | Admitting: Physician Assistant

## 2018-05-11 DIAGNOSIS — F411 Generalized anxiety disorder: Secondary | ICD-10-CM

## 2018-05-11 DIAGNOSIS — F410 Panic disorder [episodic paroxysmal anxiety] without agoraphobia: Secondary | ICD-10-CM

## 2018-05-11 NOTE — Telephone Encounter (Signed)
NO MORE REFILLS UNTIL SEEN IN OFFICE!!!!!

## 2018-06-04 ENCOUNTER — Other Ambulatory Visit: Payer: Self-pay | Admitting: Physician Assistant

## 2018-06-19 ENCOUNTER — Other Ambulatory Visit: Payer: Self-pay | Admitting: Physician Assistant

## 2018-06-19 DIAGNOSIS — F410 Panic disorder [episodic paroxysmal anxiety] without agoraphobia: Secondary | ICD-10-CM

## 2018-06-19 DIAGNOSIS — F411 Generalized anxiety disorder: Secondary | ICD-10-CM

## 2018-06-27 ENCOUNTER — Other Ambulatory Visit: Payer: Self-pay | Admitting: Physician Assistant

## 2018-06-27 DIAGNOSIS — F410 Panic disorder [episodic paroxysmal anxiety] without agoraphobia: Secondary | ICD-10-CM

## 2018-06-27 DIAGNOSIS — F411 Generalized anxiety disorder: Secondary | ICD-10-CM

## 2018-06-27 NOTE — Telephone Encounter (Signed)
lvm asking that she rtn call to schedule a virtual office visit for medication refills...Marland KitchenMarland KitchenHeath Gold, CMA

## 2018-07-06 ENCOUNTER — Other Ambulatory Visit: Payer: Self-pay | Admitting: Physician Assistant

## 2018-07-06 DIAGNOSIS — F411 Generalized anxiety disorder: Secondary | ICD-10-CM

## 2018-07-06 DIAGNOSIS — F410 Panic disorder [episodic paroxysmal anxiety] without agoraphobia: Secondary | ICD-10-CM

## 2018-07-07 ENCOUNTER — Other Ambulatory Visit: Payer: Self-pay | Admitting: Physician Assistant

## 2018-07-07 DIAGNOSIS — F410 Panic disorder [episodic paroxysmal anxiety] without agoraphobia: Secondary | ICD-10-CM

## 2018-07-07 DIAGNOSIS — F411 Generalized anxiety disorder: Secondary | ICD-10-CM

## 2018-08-02 ENCOUNTER — Other Ambulatory Visit: Payer: Self-pay | Admitting: Physician Assistant

## 2018-08-02 NOTE — Telephone Encounter (Signed)
Called Patient and left voice mail to reschedule.

## 2018-08-02 NOTE — Telephone Encounter (Signed)
Please call patient and schedule office visit/labs with Jade prior to refills. Thanks!

## 2018-08-15 ENCOUNTER — Telehealth: Payer: Self-pay | Admitting: Physician Assistant

## 2018-08-15 ENCOUNTER — Encounter: Payer: Self-pay | Admitting: Physician Assistant

## 2018-08-15 ENCOUNTER — Telehealth (INDEPENDENT_AMBULATORY_CARE_PROVIDER_SITE_OTHER): Payer: BLUE CROSS/BLUE SHIELD | Admitting: Physician Assistant

## 2018-08-15 VITALS — Ht 59.0 in | Wt 116.0 lb

## 2018-08-15 DIAGNOSIS — F410 Panic disorder [episodic paroxysmal anxiety] without agoraphobia: Secondary | ICD-10-CM | POA: Diagnosis not present

## 2018-08-15 DIAGNOSIS — F411 Generalized anxiety disorder: Secondary | ICD-10-CM | POA: Diagnosis not present

## 2018-08-15 DIAGNOSIS — E039 Hypothyroidism, unspecified: Secondary | ICD-10-CM

## 2018-08-15 MED ORDER — HYDROXYZINE PAMOATE 25 MG PO CAPS: ORAL_CAPSULE | ORAL | 3 refills | Status: DC

## 2018-08-15 MED ORDER — LEVOTHYROXINE SODIUM 125 MCG PO TABS: 125.0000 ug | ORAL_TABLET | Freq: Every day | ORAL | 1 refills | Status: DC

## 2018-08-15 MED ORDER — ALPRAZOLAM 0.5 MG PO TABS: 0.5000 mg | ORAL_TABLET | Freq: Two times a day (BID) | ORAL | 2 refills | Status: DC | PRN

## 2018-08-15 MED ORDER — ESCITALOPRAM OXALATE 20 MG PO TABS: 20.0000 mg | ORAL_TABLET | Freq: Every day | ORAL | 3 refills | Status: DC

## 2018-08-15 NOTE — Progress Notes (Deleted)
Following up for anxiety/depression and thyroid. Due for labs. PHQ9-GAD7 completed.

## 2018-08-15 NOTE — Telephone Encounter (Signed)
Records requested

## 2018-08-15 NOTE — Telephone Encounter (Signed)
Last pap [facility] with Mitchel Honour

## 2018-08-15 NOTE — Progress Notes (Signed)
Patient ID: Heidi Levy, female   DOB: February 06, 1991, 28 y.o.   MRN: [id number] .[name].Virtual Visit via Video Note  I connected with Heidi CoderElizabeth Carter Janosik on 08/15/18 at 11:10 AM EDT by a video enabled telemedicine application and verified that I am speaking with the correct person using two identifiers.  Location: Patient: home Provider: home   I discussed the limitations of evaluation and management by telemedicine and the availability of in person appointments. The patient expressed understanding and agreed to proceed.  History of Present Illness: Pt is a 28 yo female with hypothyroidism, GAD who calls into the clinic for medication refill.   She is doing "ok". She has noticed her anxiety increasing during COVID-19 pandemic. She has felt some of her anxiety symptoms coming back. She went from using xanax twice a month to 3-4 times a week. She continues in [employer] to graduate this fall. No SI/HC. She remains on lexapro. She was going to counseling about 1 year ago but stopped. Ok with restarting.   She needs refill on levothyroxine. No problems or concerns.   . Active Ambulatory Problems    Diagnosis Date Noted  . Panic attack 05/10/2014  . GAD (generalized anxiety disorder) 05/10/2014  . Palpitations 05/10/2014  . Elevated blood pressure 06/19/2014  . Essential hypertension, benign 06/20/2014  . Hypothyroidism 01/15/2015   Resolved Ambulatory Problems    Diagnosis Date Noted  . No Resolved Ambulatory Problems   No Additional Past Medical History   Reviewed med, allergy, problem list.     Observations/Objective: No acute distress. Normal breathing.  Normal mood.   .. Today's Vitals   08/15/18 0919  Weight: 116 lb (52.6 kg)  Height: 4\' 11"  (1.499 m)   Body mass index is 23.43 kg/m.  .. [facility] 08/15/2018 04/22/2017 12/19/2015  Decreased Interest 1 0 1  Down, Depressed, Hopeless 1 1 1   PHQ - 2 Score 2 1 2   Altered sleeping 1 0 2  Tired,  decreased energy 1 1 1   Change in appetite 0 0 0  Feeling bad or failure about yourself  1 0 1  Trouble concentrating 1 1 0  Moving slowly or fidgety/restless 0 0 0  Suicidal thoughts 0 0 0  PHQ-9 Score 6 3 6   Difficult doing work/chores Somewhat difficult Not difficult at all -   .. GAD 7 : Generalized Anxiety Score 08/15/2018 04/22/2017 12/19/2015 12/19/2015  Nervous, Anxious, on Edge 3 0 2 2  Control/stop worrying 3 0 1 1  Worry too much - different things 3 1 1 1   Trouble relaxing 3 0 0 0  Restless 2 0 0 0  Easily annoyed or irritable 1 0 1 0  Afraid - awful might happen 1 0 0 0  Total GAD 7 Score 16 1 5 4   Anxiety Difficulty Not difficult at all Not difficult at all Somewhat difficult Somewhat difficult      Assessment and Plan: [name].[name].Lanora Manislizabeth was seen today for thyroid problem.  Diagnoses and all orders for this visit:  GAD (generalized anxiety disorder) -     hydrOXYzine (VISTARIL) 25 MG capsule; TAKE 1 CAPSULE BY MOUTH DAILY -     escitalopram (LEXAPRO) 20 MG tablet; Take 1 tablet (20 mg total) by mouth daily. -     ALPRAZolam (XANAX) 0.5 MG tablet; Take 1 tablet (0.5 mg total) by mouth 2 (two) times daily as needed for anxiety. -     COMPLETE METABOLIC PANEL WITH GFR -  Ambulatory referral to Psychology  Panic attack -     hydrOXYzine (VISTARIL) 25 MG capsule; TAKE 1 CAPSULE BY MOUTH DAILY -     escitalopram (LEXAPRO) 20 MG tablet; Take 1 tablet (20 mg total) by mouth daily. -     ALPRAZolam (XANAX) 0.5 MG tablet; Take 1 tablet (0.5 mg total) by mouth 2 (two) times daily as needed for anxiety. -     Ambulatory referral to Psychology  Acquired hypothyroidism -     levothyroxine (SYNTHROID) 125 MCG tablet; Take 1 tablet (125 mcg total) by mouth daily before breakfast. -     TSH   GAD and PHQ-9 has increased. No changes made to medication today. Encouraged counseling. She agrees will make referral. Discussed coping mechanisms. Follow up as needed.   Will get labs and  adjust thyroid medication as needed.   Follow Up Instructions:    I discussed the assessment and treatment plan with the patient. The patient was provided an opportunity to ask questions and all were answered. The patient agreed with the plan and demonstrated an understanding of the instructions.   The patient was advised to call back or seek an in-person evaluation if the symptoms worsen or if the condition fails to improve as anticipated.   Tandy Gaw, PA-C

## 2018-09-13 ENCOUNTER — Ambulatory Visit (INDEPENDENT_AMBULATORY_CARE_PROVIDER_SITE_OTHER): Payer: BC Managed Care – PPO | Admitting: Psychology

## 2018-09-13 DIAGNOSIS — F411 Generalized anxiety disorder: Secondary | ICD-10-CM | POA: Diagnosis not present

## 2018-09-21 ENCOUNTER — Encounter: Payer: Self-pay | Admitting: Physician Assistant

## 2018-10-09 ENCOUNTER — Ambulatory Visit (INDEPENDENT_AMBULATORY_CARE_PROVIDER_SITE_OTHER): Payer: BC Managed Care – PPO | Admitting: Psychology

## 2018-10-09 DIAGNOSIS — F411 Generalized anxiety disorder: Secondary | ICD-10-CM | POA: Diagnosis not present

## 2018-10-12 DIAGNOSIS — L720 Epidermal cyst: Secondary | ICD-10-CM | POA: Diagnosis not present

## 2018-10-12 DIAGNOSIS — D225 Melanocytic nevi of trunk: Secondary | ICD-10-CM | POA: Diagnosis not present

## 2018-10-12 DIAGNOSIS — L821 Other seborrheic keratosis: Secondary | ICD-10-CM | POA: Diagnosis not present

## 2018-10-23 ENCOUNTER — Ambulatory Visit (INDEPENDENT_AMBULATORY_CARE_PROVIDER_SITE_OTHER): Payer: BC Managed Care – PPO | Admitting: Psychology

## 2018-10-23 DIAGNOSIS — F411 Generalized anxiety disorder: Secondary | ICD-10-CM

## 2018-11-02 ENCOUNTER — Other Ambulatory Visit: Payer: Self-pay

## 2018-11-02 DIAGNOSIS — Z20822 Contact with and (suspected) exposure to covid-19: Secondary | ICD-10-CM

## 2018-11-02 DIAGNOSIS — R6889 Other general symptoms and signs: Secondary | ICD-10-CM | POA: Diagnosis not present

## 2018-11-03 LAB — NOVEL CORONAVIRUS, NAA: SARS-CoV-2, NAA: NOT DETECTED

## 2018-11-07 ENCOUNTER — Ambulatory Visit (INDEPENDENT_AMBULATORY_CARE_PROVIDER_SITE_OTHER): Payer: BC Managed Care – PPO | Admitting: Psychology

## 2018-11-07 DIAGNOSIS — F411 Generalized anxiety disorder: Secondary | ICD-10-CM

## 2018-11-16 DIAGNOSIS — H10413 Chronic giant papillary conjunctivitis, bilateral: Secondary | ICD-10-CM | POA: Diagnosis not present

## 2018-11-22 ENCOUNTER — Ambulatory Visit (INDEPENDENT_AMBULATORY_CARE_PROVIDER_SITE_OTHER): Payer: BC Managed Care – PPO | Admitting: Psychology

## 2018-11-22 DIAGNOSIS — F411 Generalized anxiety disorder: Secondary | ICD-10-CM | POA: Diagnosis not present

## 2018-12-06 ENCOUNTER — Ambulatory Visit (INDEPENDENT_AMBULATORY_CARE_PROVIDER_SITE_OTHER): Payer: BC Managed Care – PPO | Admitting: Psychology

## 2018-12-06 DIAGNOSIS — F411 Generalized anxiety disorder: Secondary | ICD-10-CM | POA: Diagnosis not present

## 2018-12-07 DIAGNOSIS — S61217A Laceration without foreign body of left little finger without damage to nail, initial encounter: Secondary | ICD-10-CM | POA: Diagnosis not present

## 2018-12-19 ENCOUNTER — Ambulatory Visit (INDEPENDENT_AMBULATORY_CARE_PROVIDER_SITE_OTHER): Payer: BC Managed Care – PPO | Admitting: Psychology

## 2018-12-19 DIAGNOSIS — F411 Generalized anxiety disorder: Secondary | ICD-10-CM | POA: Diagnosis not present

## 2018-12-20 DIAGNOSIS — Z30432 Encounter for removal of intrauterine contraceptive device: Secondary | ICD-10-CM | POA: Diagnosis not present

## 2018-12-20 DIAGNOSIS — Z6823 Body mass index (BMI) 23.0-23.9, adult: Secondary | ICD-10-CM | POA: Diagnosis not present

## 2018-12-20 DIAGNOSIS — Z01419 Encounter for gynecological examination (general) (routine) without abnormal findings: Secondary | ICD-10-CM | POA: Diagnosis not present

## 2019-01-02 ENCOUNTER — Ambulatory Visit (INDEPENDENT_AMBULATORY_CARE_PROVIDER_SITE_OTHER): Payer: BC Managed Care – PPO | Admitting: Psychology

## 2019-01-02 DIAGNOSIS — F411 Generalized anxiety disorder: Secondary | ICD-10-CM

## 2019-01-16 ENCOUNTER — Ambulatory Visit (INDEPENDENT_AMBULATORY_CARE_PROVIDER_SITE_OTHER): Payer: BC Managed Care – PPO | Admitting: Psychology

## 2019-01-16 DIAGNOSIS — F411 Generalized anxiety disorder: Secondary | ICD-10-CM | POA: Diagnosis not present

## 2019-01-30 ENCOUNTER — Ambulatory Visit: Payer: BC Managed Care – PPO | Admitting: Psychology

## 2019-01-31 ENCOUNTER — Ambulatory Visit (INDEPENDENT_AMBULATORY_CARE_PROVIDER_SITE_OTHER): Payer: BC Managed Care – PPO | Admitting: Psychology

## 2019-01-31 DIAGNOSIS — F411 Generalized anxiety disorder: Secondary | ICD-10-CM

## 2019-02-13 ENCOUNTER — Ambulatory Visit (INDEPENDENT_AMBULATORY_CARE_PROVIDER_SITE_OTHER): Payer: BC Managed Care – PPO | Admitting: Psychology

## 2019-02-13 DIAGNOSIS — F411 Generalized anxiety disorder: Secondary | ICD-10-CM

## 2019-02-27 ENCOUNTER — Ambulatory Visit (INDEPENDENT_AMBULATORY_CARE_PROVIDER_SITE_OTHER): Payer: BC Managed Care – PPO | Admitting: Psychology

## 2019-02-27 DIAGNOSIS — F411 Generalized anxiety disorder: Secondary | ICD-10-CM

## 2019-03-13 ENCOUNTER — Ambulatory Visit (INDEPENDENT_AMBULATORY_CARE_PROVIDER_SITE_OTHER): Payer: BC Managed Care – PPO | Admitting: Psychology

## 2019-03-13 DIAGNOSIS — F411 Generalized anxiety disorder: Secondary | ICD-10-CM | POA: Diagnosis not present

## 2019-03-18 IMAGING — US US PELVIS COMPLETE TRANSABD/TRANSVAG
1 series · 14 of 25 positions shown · non-contrast
Comparison: None

CLINICAL DATA: Short stature and hypothyroidism. Question Turner's
syndrome.

EXAM:
TRANSABDOMINAL AND TRANSVAGINAL ULTRASOUND OF PELVIS
TECHNIQUE: Both transabdominal and transvaginal ultrasound examinations of the
pelvis were performed. Transabdominal technique was performed for
global imaging of the pelvis including uterus, ovaries, adnexal
regions, and pelvic cul-de-sac. It was necessary to proceed with
endovaginal exam following the transabdominal exam to visualize the
endometrium and ovaries.

[Series 1: us pelvis complete transabd/transvag · 0.16mm/px · 14 of 82 slices shown]
[im 1/82]
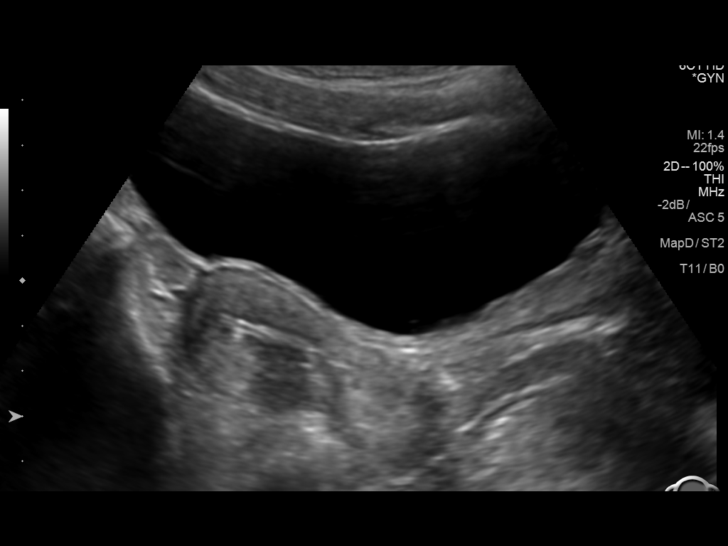
[im 7/82]
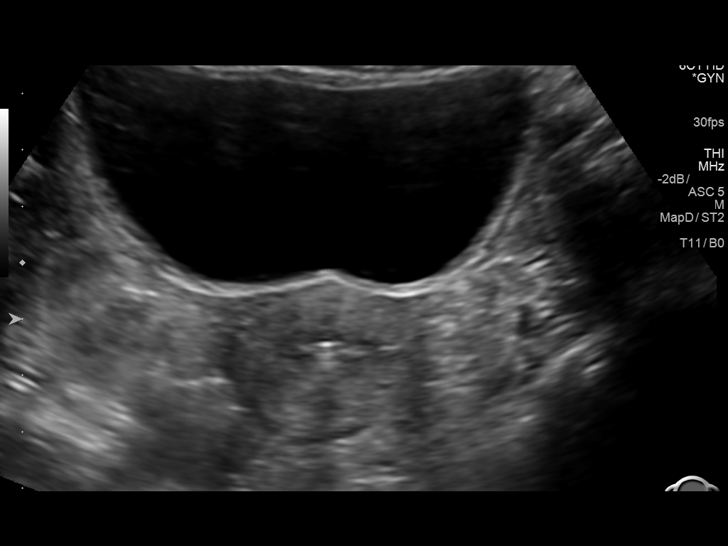
[im 14/82]
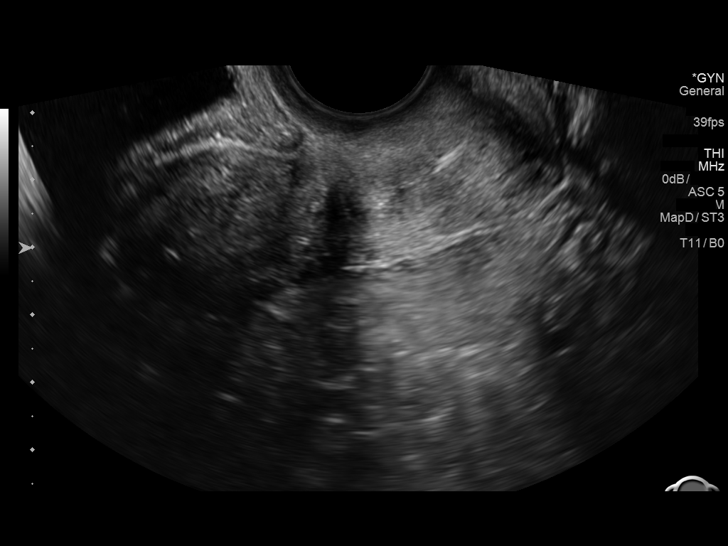
[im 21/82]
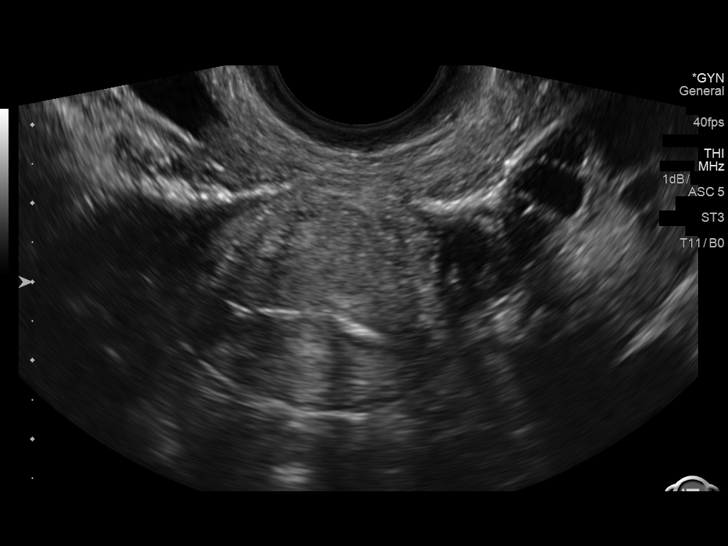
[im 28/82]
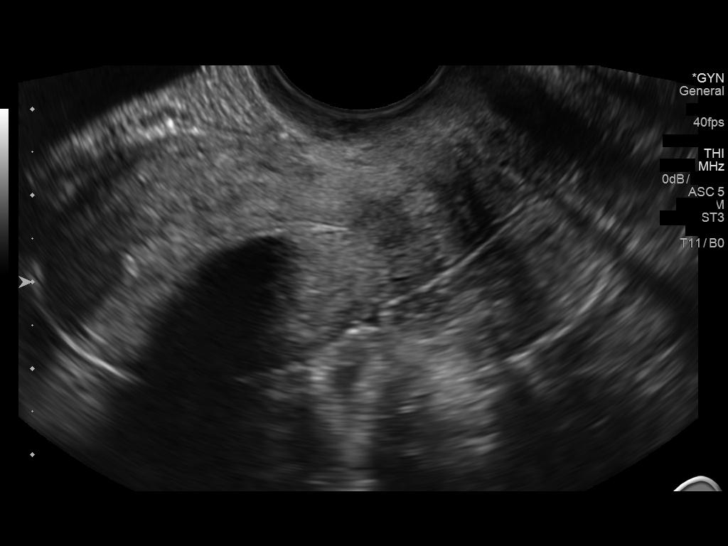
[im 31/82]
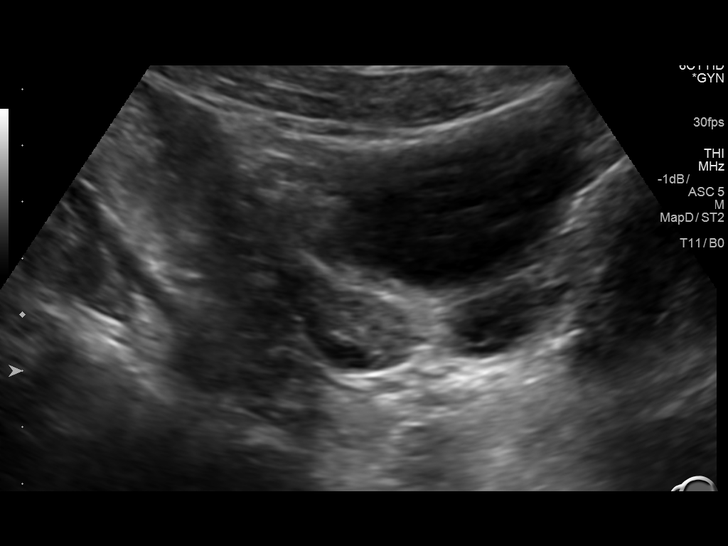
[im 38/82]
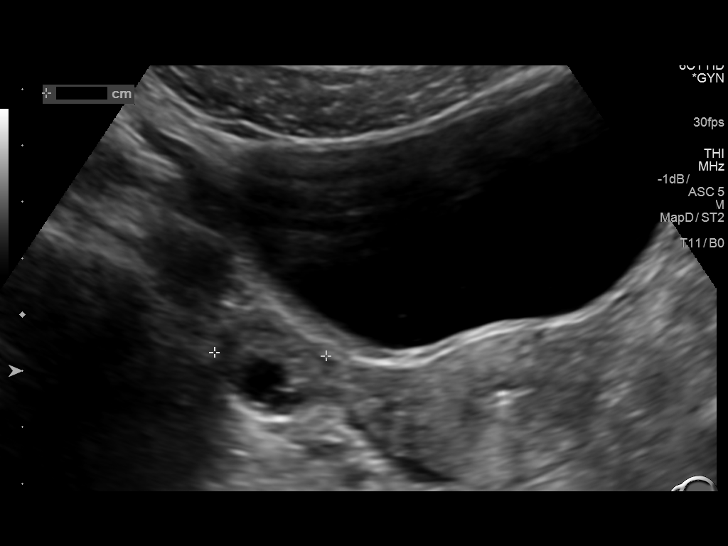
[im 44/82]
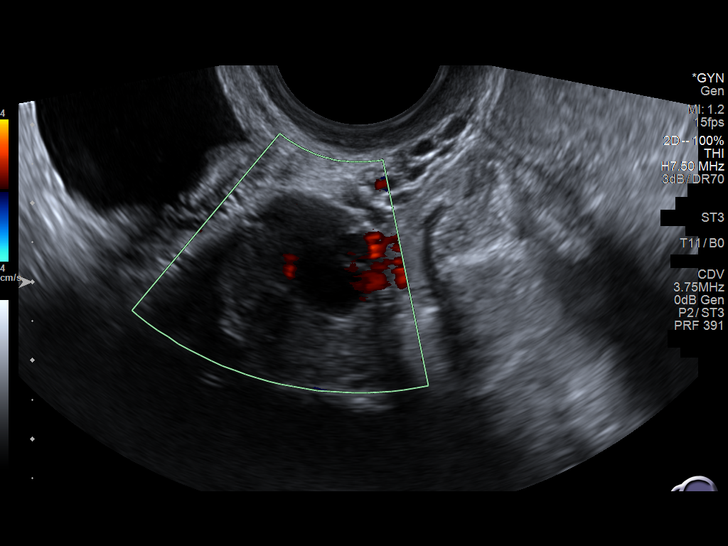
[im 51/82]
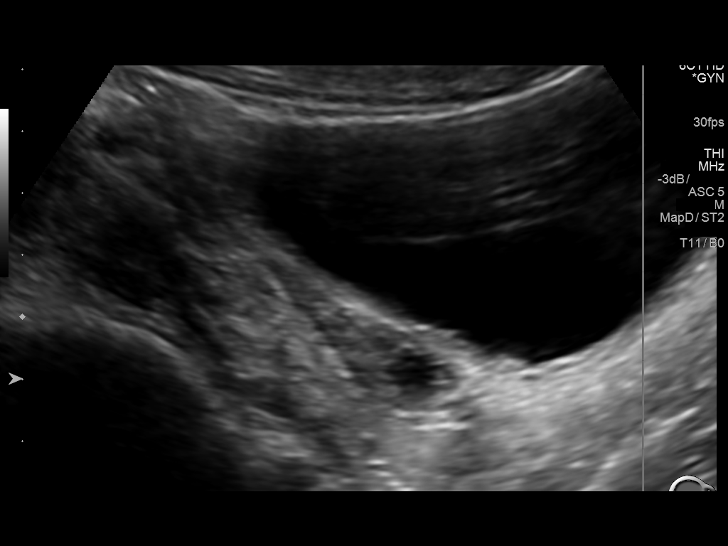
[im 55/82]
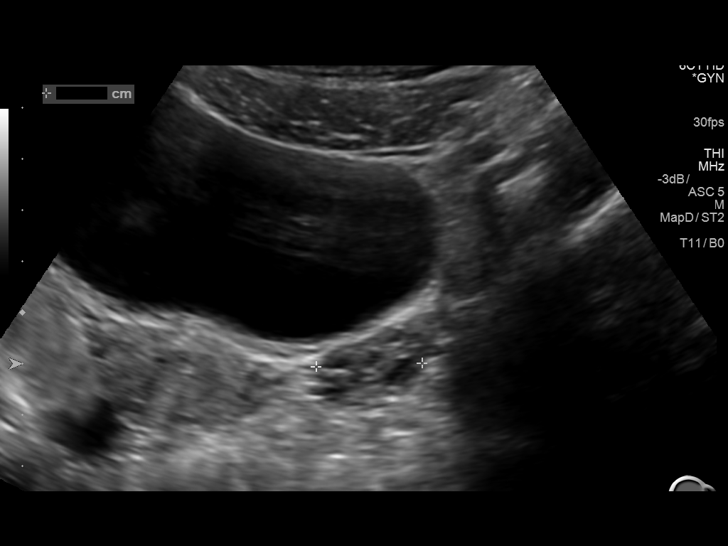
[im 61/82]
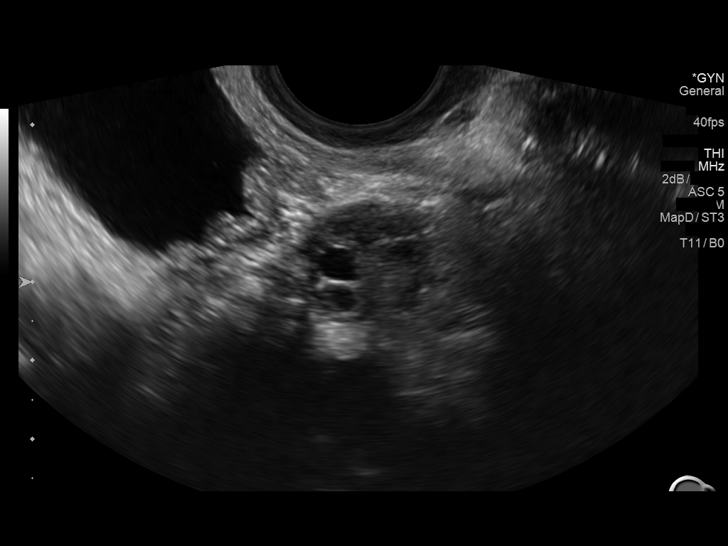
[im 68/82]
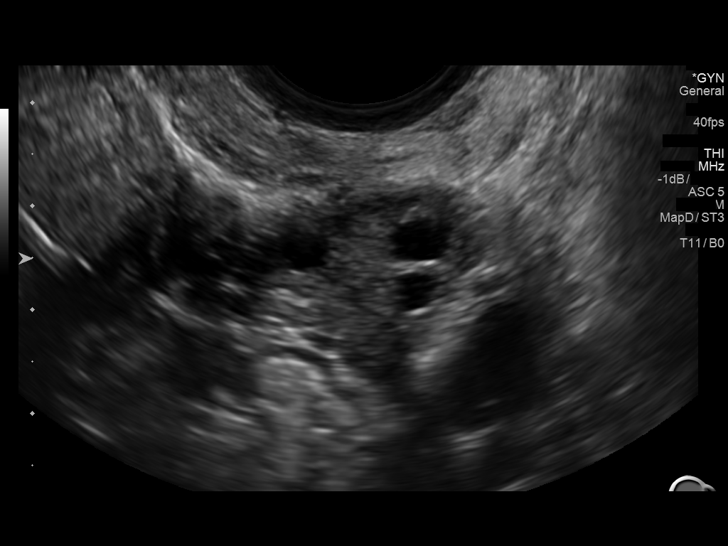
[im 75/82]
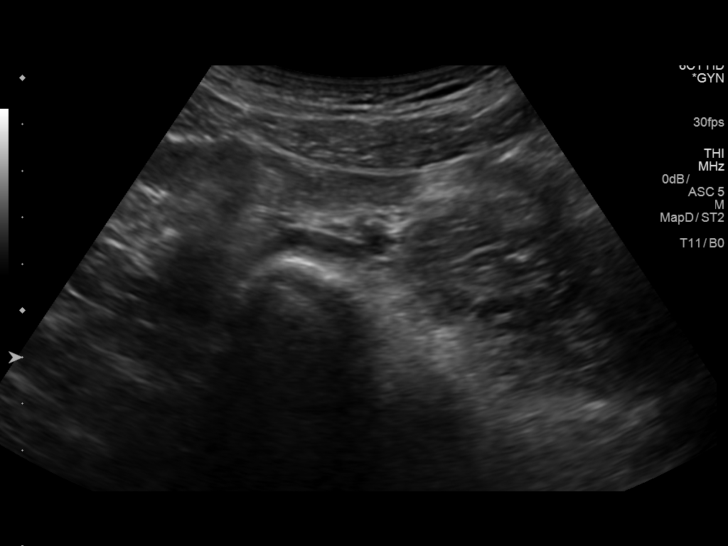
[im 82/82]
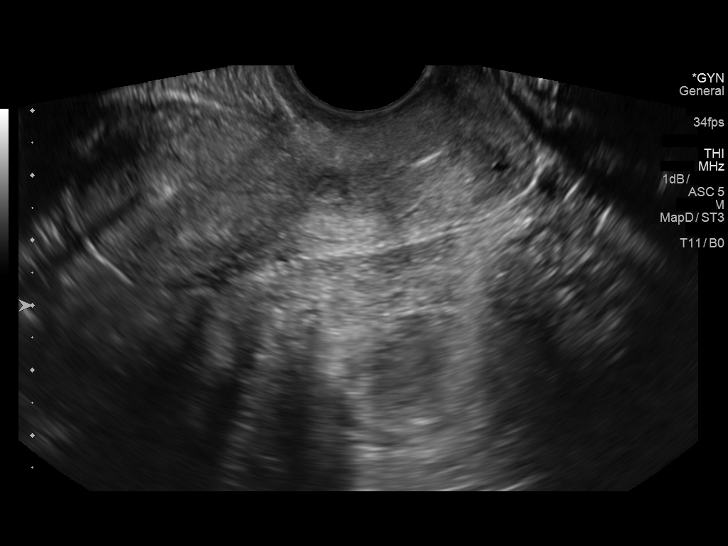

[14 of 25 positions shown; findings below may reference images not displayed]

FINDINGS: Uterus

Measurements: 7 x 4 x 3 cm.  No fibroids or other mass visualized.

Endometrium

Thickness: 5 mm.  There is an IUD in expected position.

Right ovary

Measurements: 24 x 21 x 27 mm.  Normal appearance/no adnexal mass.

Left ovary

Measurements: 24 x 19 x 21 mm.  Normal appearance/no adnexal mass.

Other findings

No abnormal free fluid.
IMPRESSION: Normal pelvic ultrasound.  Normal size uterus.

## 2019-03-27 ENCOUNTER — Ambulatory Visit (INDEPENDENT_AMBULATORY_CARE_PROVIDER_SITE_OTHER): Payer: BC Managed Care – PPO | Admitting: Psychology

## 2019-03-27 DIAGNOSIS — F411 Generalized anxiety disorder: Secondary | ICD-10-CM | POA: Diagnosis not present

## 2019-04-10 ENCOUNTER — Ambulatory Visit (INDEPENDENT_AMBULATORY_CARE_PROVIDER_SITE_OTHER): Payer: BC Managed Care – PPO | Admitting: Psychology

## 2019-04-10 DIAGNOSIS — F411 Generalized anxiety disorder: Secondary | ICD-10-CM

## 2019-04-27 ENCOUNTER — Ambulatory Visit (INDEPENDENT_AMBULATORY_CARE_PROVIDER_SITE_OTHER): Payer: BC Managed Care – PPO | Admitting: Psychology

## 2019-04-27 DIAGNOSIS — F411 Generalized anxiety disorder: Secondary | ICD-10-CM

## 2019-04-30 ENCOUNTER — Encounter: Payer: Self-pay | Admitting: Physician Assistant

## 2019-05-01 ENCOUNTER — Telehealth (INDEPENDENT_AMBULATORY_CARE_PROVIDER_SITE_OTHER): Payer: BC Managed Care – PPO | Admitting: Physician Assistant

## 2019-05-01 ENCOUNTER — Encounter: Payer: Self-pay | Admitting: Physician Assistant

## 2019-05-01 VITALS — BP 128/67 | Ht 59.0 in | Wt 116.0 lb

## 2019-05-01 DIAGNOSIS — F411 Generalized anxiety disorder: Secondary | ICD-10-CM | POA: Diagnosis not present

## 2019-05-01 DIAGNOSIS — F331 Major depressive disorder, recurrent, moderate: Secondary | ICD-10-CM | POA: Insufficient documentation

## 2019-05-01 DIAGNOSIS — F410 Panic disorder [episodic paroxysmal anxiety] without agoraphobia: Secondary | ICD-10-CM | POA: Diagnosis not present

## 2019-05-01 MED ORDER — BUSPIRONE HCL 7.5 MG PO TABS: 7.5000 mg | ORAL_TABLET | Freq: Two times a day (BID) | ORAL | 1 refills | Status: DC

## 2019-05-01 NOTE — Progress Notes (Signed)
Patient ID: Heidi Levy, female   DOB: 10-03-1990, 29 y.o.   MRN: [id number] .Marland KitchenVirtual Visit via Video Note  I connected with Heidi Levy on 05/01/19 at 10:30 AM EST by a video enabled telemedicine application and verified that I am speaking with the correct person using two identifiers.  Location: Patient: home Provider: clinic   I discussed the limitations of evaluation and management by telemedicine and the availability of in person appointments. The patient expressed understanding and agreed to proceed.  History of Present Illness: Pt is a 29 yo female with GAD, MDD who calls into the clinic to discuss mood.   She has a lot going on and noticed her anxiety and depression getting worse. She applied to graduate school. She is supposed to be moving soon. Her husband is being deployed. She has had episodes of sudden chest tightness and overwhelming feelings of panic that last 5-10 minutes. She feels like something is sitting on her chest. No symptoms exacerbated by exertion. The last attack she took xanax and did help. She has had 2-3 times in the past 2 weeks. She continues on lexapro. She continues with counseling with [place] [facility]. No SI/HC.   [name]. Active Ambulatory Problems    Diagnosis Date Noted  . Panic attacks 05/10/2014  . GAD (generalized anxiety disorder) 05/10/2014  . Palpitations 05/10/2014  . Elevated blood pressure 06/19/2014  . Essential hypertension, benign 06/20/2014  . Hypothyroidism 01/15/2015  . Moderate episode of recurrent major depressive disorder (HCC) 05/01/2019   Resolved Ambulatory Problems    Diagnosis Date Noted  . No Resolved Ambulatory Problems   No Additional Past Medical History   Reviewed med, allergy, problem list.     Observations/Objective: No acute distress Normal mood and appearance.  Normal breathing.   .. Today's Vitals   05/01/19 1011  BP: 128/67  Weight: 116 lb (52.6 kg)  Height: 4\' 11"  (1.499 m)    Body mass index is 23.43 kg/m.   .. Depression screen [facility] 2/9 05/01/2019 08/15/2018 04/22/2017 12/19/2015  Decreased Interest 1 1 0 1  Down, Depressed, Hopeless 1 1 1 1   PHQ - 2 Score 2 2 1 2   Altered sleeping 0 1 0 2  Tired, decreased energy 3 1 1 1   Change in appetite 3 0 0 0  Feeling bad or failure about yourself  1 1 0 1  Trouble concentrating 3 1 1  0  Moving slowly or fidgety/restless 0 0 0 0  Suicidal thoughts 0 0 0 0  PHQ-9 Score 12 6 3 6   Difficult doing work/chores Somewhat difficult Somewhat difficult Not difficult at all -   .. GAD 7 : Generalized Anxiety Score 05/01/2019 08/15/2018 04/22/2017 12/19/2015  Nervous, Anxious, on Edge 3 3 0 2  Control/stop worrying 1 3 0 1  Worry too much - different things 1 3 1 1   Trouble relaxing 3 3 0 0  Restless 0 2 0 0  Easily annoyed or irritable 1 1 0 1  Afraid - awful might happen 1 1 0 0  Total GAD 7 Score 10 16 1 5   Anxiety Difficulty Not difficult at all Not difficult at all Not difficult at all Somewhat difficult     Assessment and Plan:  Amera was seen today for anxiety.  Diagnoses and all orders for this visit:  GAD (generalized anxiety disorder) -     busPIRone (BUSPAR) 7.5 MG tablet; Take 1 tablet (7.5 mg total) by mouth 2 (two) times daily.  Panic attacks -  busPIRone (BUSPAR) 7.5 MG tablet; Take 1 tablet (7.5 mg total) by mouth 2 (two) times daily.  Moderate episode of recurrent major depressive disorder ([place])   Reassured sounded like panic attacks. Ok to use as needed xanax. Added buspar bid. Continue on lexapro. Continue with counseling. Make sure walking daily. If no improvement in 4 weeks consider replacing lexapro with viibryd or trintelix.   Follow up in 4-6 weeks.    Follow Up Instructions:    I discussed the assessment and treatment plan with the patient. The patient was provided an opportunity to ask questions and all were answered. The patient agreed with the plan and demonstrated an  understanding of the instructions.   The patient was advised to call back or seek an in-person evaluation if the symptoms worsen or if the condition fails to improve as anticipated.  I provided 23 minutes of non-face-to-face time during this encounter.   Iran Planas, PA-C

## 2019-05-01 NOTE — Progress Notes (Signed)
PHQ9 (12) -GAD7 (10) completed.  Wants to discuss increased anxiety/depression. No other issues.

## 2019-05-01 NOTE — Patient Instructions (Signed)
Panic Attack A panic attack is a sudden episode of severe anxiety, fear, or discomfort that causes physical and emotional symptoms. The attack may be in response to something frightening, or it may occur for no known reason. Symptoms of a panic attack can be similar to symptoms of a heart attack or stroke. It is important to see your health care provider when you have a panic attack so that these conditions can be ruled out. A panic attack is a symptom of another condition. Most panic attacks go away with treatment of the underlying problem. If you have panic attacks often, you may have a condition called panic disorder. What are the causes? A panic attack may be caused by:  An extreme, life-threatening situation, such as a war or natural disaster.  An anxiety disorder, such as post-traumatic stress disorder.  Depression.  Certain medical conditions, including heart problems, neurological conditions, and infections.  Certain over-the-counter and prescription medicines.  Illegal drugs that increase heart rate and blood pressure, such as methamphetamine.  Alcohol.  Supplements that increase anxiety.  Panic disorder. What increases the risk? You are more likely to develop this condition if:  You have an anxiety disorder.  You have another mental health condition.  You take certain medicines.  You use alcohol, illegal drugs, or other substances.  You are under extreme stress.  A life event is causing increased feelings of anxiety and depression. What are the signs or symptoms? A panic attack starts suddenly, usually lasts about 20 minutes, and occurs with one or more of the following:  A pounding heart.  A feeling that your heart is beating irregularly or faster than normal (palpitations).  Sweating.  Trembling or shaking.  Shortness of breath or feeling smothered.  Feeling choked.  Chest pain or discomfort.  Nausea or a strange feeling in your  stomach.  Dizziness, feeling lightheaded, or feeling like you might faint.  Chills or hot flashes.  Numbness or tingling in your lips, hands, or feet.  Feeling confused, or feeling that you are not yourself.  Fear of losing control or being emotionally unstable.  Fear of dying. How is this diagnosed? A panic attack is diagnosed with an assessment by your health care provider. During the assessment your health care provider will ask questions about:  Your history of anxiety, depression, and panic attacks.  Your medical history.  Whether you drink alcohol, use illegal drugs, take supplements, or take medicines. Be honest about your substance use. Your health care provider may also:  Order blood tests or other kinds of tests to rule out serious medical conditions.  Refer you to a mental health professional for further evaluation. How is this treated? Treatment depends on the cause of the panic attack:  If the cause is a medical problem, your health care provider will either treat that problem or refer you to a specialist.  If the cause is emotional, you may be given anti-anxiety medicines or referred to a counselor. These medicines may reduce how often attacks happen, reduce how severe the attacks are, and lower anxiety.  If the cause is a medicine, your health care provider may tell you to stop the medicine, change your dose, or take a different medicine.  If the cause is a drug, treatment may involve letting the drug wear off and taking medicine to help the drug leave your body or to counteract its effects. Attacks caused by drug abuse may continue even if you stop using the drug. Follow these instructions   at home:  Take over-the-counter and prescription medicines only as told by your health care provider.  If you feel anxious, limit your caffeine intake.  Take good care of your physical and mental health by: ? Eating a balanced diet that includes plenty of fresh fruits and  vegetables, whole grains, lean meats, and low-fat dairy. ? Getting plenty of rest. Try to get 7-8 hours of uninterrupted sleep each night. ? Exercising regularly. Try to get 30 minutes of physical activity at least 5 days a week. ? Not smoking. Talk to your health care provider if you need help quitting. ? Limiting alcohol intake to no more than 1 drink a day for nonpregnant women and 2 drinks a day for men. One drink equals 12 oz of beer, 5 oz of wine, or 1 oz of hard liquor.  Keep all follow-up visits as told by your health care provider. This is important. Panic attacks may have underlying physical or emotional problems that take time to accurately diagnose. Contact a health care provider if:  Your symptoms do not improve, or they get worse.  You are not able to take your medicine as prescribed because of side effects. Get help right away if:  You have serious thoughts about hurting yourself or others.  You have symptoms of a panic attack. Do not drive yourself to the hospital. Have someone else drive you or call an ambulance. If you ever feel like you may hurt yourself or others, or you have thoughts about taking your own life, get help right away. You can go to your nearest emergency department or call:  Your local emergency services (911 in the [place]  A suicide crisis helpline, such as the National Suicide Prevention Lifeline at 1-800-273-8255. This is open 24 hours a day. Summary  A panic attack is a sign of a serious health or mental health condition. Get help right away. Do not drive yourself to the hospital. Have someone else drive you or call an ambulance.  Always see a health care provider to have the reasons for the panic attack correctly diagnosed.  If your panic attack was caused by a physical problem, follow your health care provider's suggestions for medicine, referral to a specialist, and lifestyle changes.  If your panic attack was caused by an emotional problem,  follow through with counseling from a qualified mental health specialist.  If you feel like you may hurt yourself or others, call 911 and get help right away. This information is not intended to replace advice given to you by your health care provider. Make sure you discuss any questions you have with your health care provider. Document Revised: 02/11/2017 Document Reviewed: 04/09/2016 Elsevier Patient Education  2020 Elsevier Inc.  

## 2019-05-11 ENCOUNTER — Ambulatory Visit (INDEPENDENT_AMBULATORY_CARE_PROVIDER_SITE_OTHER): Payer: BC Managed Care – PPO | Admitting: Psychology

## 2019-05-11 DIAGNOSIS — F411 Generalized anxiety disorder: Secondary | ICD-10-CM

## 2019-05-15 ENCOUNTER — Ambulatory Visit (INDEPENDENT_AMBULATORY_CARE_PROVIDER_SITE_OTHER): Payer: BC Managed Care – PPO | Admitting: Psychology

## 2019-05-15 DIAGNOSIS — F411 Generalized anxiety disorder: Secondary | ICD-10-CM

## 2019-05-25 ENCOUNTER — Ambulatory Visit (INDEPENDENT_AMBULATORY_CARE_PROVIDER_SITE_OTHER): Payer: BC Managed Care – PPO | Admitting: Psychology

## 2019-05-25 DIAGNOSIS — F411 Generalized anxiety disorder: Secondary | ICD-10-CM | POA: Diagnosis not present

## 2019-05-27 ENCOUNTER — Encounter: Payer: Self-pay | Admitting: Physician Assistant

## 2019-05-28 MED ORDER — PAROXETINE HCL 40 MG PO TABS: ORAL_TABLET | ORAL | 1 refills | Status: DC

## 2019-06-08 ENCOUNTER — Ambulatory Visit (INDEPENDENT_AMBULATORY_CARE_PROVIDER_SITE_OTHER): Payer: BC Managed Care – PPO | Admitting: Psychology

## 2019-06-08 DIAGNOSIS — F411 Generalized anxiety disorder: Secondary | ICD-10-CM | POA: Diagnosis not present

## 2019-06-20 ENCOUNTER — Other Ambulatory Visit: Payer: Self-pay

## 2019-06-20 ENCOUNTER — Ambulatory Visit (INDEPENDENT_AMBULATORY_CARE_PROVIDER_SITE_OTHER): Payer: BC Managed Care – PPO | Admitting: Physician Assistant

## 2019-06-20 ENCOUNTER — Encounter: Payer: Self-pay | Admitting: Physician Assistant

## 2019-06-20 VITALS — BP 121/61 | HR 70 | Temp 97.9°F | Wt 114.0 lb

## 2019-06-20 DIAGNOSIS — J3489 Other specified disorders of nose and nasal sinuses: Secondary | ICD-10-CM | POA: Diagnosis not present

## 2019-06-20 DIAGNOSIS — F331 Major depressive disorder, recurrent, moderate: Secondary | ICD-10-CM

## 2019-06-20 DIAGNOSIS — F410 Panic disorder [episodic paroxysmal anxiety] without agoraphobia: Secondary | ICD-10-CM

## 2019-06-20 DIAGNOSIS — F411 Generalized anxiety disorder: Secondary | ICD-10-CM

## 2019-06-20 MED ORDER — MUPIROCIN 2 % EX OINT: 1.0000 "application " | TOPICAL_OINTMENT | Freq: Two times a day (BID) | CUTANEOUS | 2 refills | Status: DC

## 2019-06-20 MED ORDER — PAROXETINE HCL 40 MG PO TABS: 40.0000 mg | ORAL_TABLET | ORAL | 2 refills | Status: DC

## 2019-06-20 MED ORDER — CEPHALEXIN 500 MG PO CAPS: 500.0000 mg | ORAL_CAPSULE | Freq: Two times a day (BID) | ORAL | 0 refills | Status: DC

## 2019-06-20 MED ORDER — BUSPIRONE HCL 7.5 MG PO TABS: 7.5000 mg | ORAL_TABLET | Freq: Two times a day (BID) | ORAL | 2 refills | Status: DC

## 2019-06-20 NOTE — Progress Notes (Signed)
Subjective:    Patient ID: Heidi Levy, female    DOB: 08-26-1990, 29 y.o.   MRN: [id number]  HPI Patient is a 29 year old female who presents to the clinic with recurrent sore in her left nare. No reason for worsening. It will get red, swollen and crusty and then get better. She has had like this for months but it will just not go away. No itching. More tender than anything. No known trauma. No piercing in that nose.   She does feel like paxil is helping a lot she does feel like depression is much better. Anxiety is better but still having to feelings of panic throughout the day. She has only had to use xanax a few times. She never started buspar with paxil.   .. Active Ambulatory Problems    Diagnosis Date Noted  . Panic attacks 05/10/2014  . GAD (generalized anxiety disorder) 05/10/2014  . Palpitations 05/10/2014  . Elevated blood pressure 06/19/2014  . Essential hypertension, benign 06/20/2014  . Hypothyroidism 01/15/2015  . Moderate episode of recurrent major depressive disorder ([place]) 05/01/2019  . Nasal vestibulitis 06/20/2019   Resolved Ambulatory Problems    Diagnosis Date Noted  . No Resolved Ambulatory Problems   No Additional Past Medical History      Review of Systems See HPI.     Objective:   Physical Exam Vitals reviewed.  Constitutional:      Appearance: Normal appearance.  HENT:     Head: Normocephalic.     Nose: No congestion or rhinorrhea.     Comments: Erythematous scab in the upper left nare with some minimal crusting. No swelling of external nare.     Mouth/Throat:     Mouth: Mucous membranes are moist.  Cardiovascular:     Rate and Rhythm: Normal rate and regular rhythm.     Pulses: Normal pulses.  Pulmonary:     Effort: Pulmonary effort is normal.     Breath sounds: Normal breath sounds.  Neurological:     Mental Status: She is alert.  Psychiatric:        Mood and Affect: Mood normal.           Assessment & Plan:   Marland KitchenMarland KitchenDiagnoses and all orders for this visit:  Nasal vestibulitis -     mupirocin ointment (BACTROBAN) 2 %; Place 1 application into the nose 2 (two) times daily. For 7 days then once a week for one month then monthly for prevention. -     cephALEXin (KEFLEX) 500 MG capsule; Take 1 capsule (500 mg total) by mouth 2 (two) times daily.  GAD (generalized anxiety disorder) -     busPIRone (BUSPAR) 7.5 MG tablet; Take 1 tablet (7.5 mg total) by mouth 2 (two) times daily. -     PARoxetine (PAXIL) 40 MG tablet; Take 1 tablet (40 mg total) by mouth every morning.  Panic attacks -     busPIRone (BUSPAR) 7.5 MG tablet; Take 1 tablet (7.5 mg total) by mouth 2 (two) times daily. -     PARoxetine (PAXIL) 40 MG tablet; Take 1 tablet (40 mg total) by mouth every morning.  Moderate episode of recurrent major depressive disorder (HCC) -     PARoxetine (PAXIL) 40 MG tablet; Take 1 tablet (40 mg total) by mouth every morning.   Discussed treatment of the nasal vestibulitis. Use bactroban for 7 days then weekly then monthly. Warm compresses as needed. Discussed nares colonizing staph. If external nare starts to swell or become  painful could start keflex. Follow up as needed.   Seems like paxil is working. Continue on paxil. Add in buspar. Pt was concerned about taking both medications. Discussed serotonin syndrome and what to look out for but risk is low. Discussed start with bid 1/2 tablet and go from there. Continue regular exercise. Follow up in 3 months or sooner if needed.

## 2019-06-22 ENCOUNTER — Other Ambulatory Visit: Payer: Self-pay | Admitting: Physician Assistant

## 2019-06-22 ENCOUNTER — Ambulatory Visit (INDEPENDENT_AMBULATORY_CARE_PROVIDER_SITE_OTHER): Payer: BC Managed Care – PPO | Admitting: Psychology

## 2019-06-22 DIAGNOSIS — E039 Hypothyroidism, unspecified: Secondary | ICD-10-CM

## 2019-06-22 DIAGNOSIS — F411 Generalized anxiety disorder: Secondary | ICD-10-CM | POA: Diagnosis not present

## 2019-06-28 ENCOUNTER — Ambulatory Visit (INDEPENDENT_AMBULATORY_CARE_PROVIDER_SITE_OTHER): Payer: BC Managed Care – PPO | Admitting: Psychology

## 2019-06-28 DIAGNOSIS — F411 Generalized anxiety disorder: Secondary | ICD-10-CM | POA: Diagnosis not present

## 2019-07-03 ENCOUNTER — Ambulatory Visit (INDEPENDENT_AMBULATORY_CARE_PROVIDER_SITE_OTHER): Payer: BC Managed Care – PPO | Admitting: Psychology

## 2019-07-03 DIAGNOSIS — F411 Generalized anxiety disorder: Secondary | ICD-10-CM

## 2019-07-07 ENCOUNTER — Encounter: Payer: Self-pay | Admitting: Physician Assistant

## 2019-07-10 ENCOUNTER — Ambulatory Visit: Payer: BC Managed Care – PPO | Admitting: Psychology

## 2019-07-17 ENCOUNTER — Ambulatory Visit (INDEPENDENT_AMBULATORY_CARE_PROVIDER_SITE_OTHER): Payer: BC Managed Care – PPO | Admitting: Psychology

## 2019-07-17 DIAGNOSIS — F411 Generalized anxiety disorder: Secondary | ICD-10-CM | POA: Diagnosis not present

## 2019-07-24 ENCOUNTER — Ambulatory Visit: Payer: BC Managed Care – PPO | Admitting: Psychology

## 2019-07-25 ENCOUNTER — Other Ambulatory Visit: Payer: Self-pay | Admitting: Physician Assistant

## 2019-07-25 DIAGNOSIS — E039 Hypothyroidism, unspecified: Secondary | ICD-10-CM

## 2019-07-31 ENCOUNTER — Ambulatory Visit: Payer: BC Managed Care – PPO | Admitting: Psychology

## 2019-08-02 ENCOUNTER — Ambulatory Visit: Payer: BC Managed Care – PPO | Admitting: Psychology

## 2019-08-06 ENCOUNTER — Ambulatory Visit (INDEPENDENT_AMBULATORY_CARE_PROVIDER_SITE_OTHER): Payer: BC Managed Care – PPO | Admitting: Psychology

## 2019-08-06 DIAGNOSIS — F411 Generalized anxiety disorder: Secondary | ICD-10-CM | POA: Diagnosis not present

## 2019-08-14 ENCOUNTER — Ambulatory Visit: Payer: BC Managed Care – PPO | Admitting: Psychology

## 2019-08-16 ENCOUNTER — Ambulatory Visit: Payer: BC Managed Care – PPO | Admitting: Psychology

## 2019-08-18 ENCOUNTER — Other Ambulatory Visit: Payer: Self-pay | Admitting: Physician Assistant

## 2019-08-18 DIAGNOSIS — E039 Hypothyroidism, unspecified: Secondary | ICD-10-CM

## 2019-08-20 ENCOUNTER — Encounter: Payer: Self-pay | Admitting: Physician Assistant

## 2019-08-21 ENCOUNTER — Other Ambulatory Visit: Payer: Self-pay

## 2019-08-21 DIAGNOSIS — E039 Hypothyroidism, unspecified: Secondary | ICD-10-CM

## 2019-08-21 DIAGNOSIS — E7800 Pure hypercholesterolemia, unspecified: Secondary | ICD-10-CM

## 2019-08-21 DIAGNOSIS — I10 Essential (primary) hypertension: Secondary | ICD-10-CM

## 2019-08-21 MED ORDER — LEVOTHYROXINE SODIUM 125 MCG PO TABS: 125.0000 ug | ORAL_TABLET | Freq: Every day | ORAL | 0 refills | Status: DC

## 2019-08-21 NOTE — Telephone Encounter (Signed)
Refilled medication. Advised to go to the lab.

## 2019-08-27 DIAGNOSIS — E7800 Pure hypercholesterolemia, unspecified: Secondary | ICD-10-CM | POA: Diagnosis not present

## 2019-08-27 DIAGNOSIS — I10 Essential (primary) hypertension: Secondary | ICD-10-CM | POA: Diagnosis not present

## 2019-08-27 DIAGNOSIS — E039 Hypothyroidism, unspecified: Secondary | ICD-10-CM | POA: Diagnosis not present

## 2019-08-27 LAB — COMPLETE METABOLIC PANEL WITH GFR
AG Ratio: 2 (calc) (ref 1.0–2.5)
ALT: 8 U/L (ref 6–29)
AST: 13 U/L (ref 10–30)
Albumin: 4.3 g/dL (ref 3.6–5.1)
Alkaline phosphatase (APISO): 46 U/L (ref 31–125)
BUN: 11 mg/dL (ref 7–25)
CO2: 28 mmol/L (ref 20–32)
Calcium: 9 mg/dL (ref 8.6–10.2)
Chloride: 106 mmol/L (ref 98–110)
Creat: 0.72 mg/dL (ref 0.50–1.10)
GFR, Est African American: 129 mL/min/{1.73_m2} (ref >=60)
GFR, Est Non African American: 112 mL/min/{1.73_m2} (ref >=60)
Globulin: 2.1 g/dL (calc) (ref 1.9–3.7)
Glucose, Bld: 82 mg/dL (ref 65–99)
Potassium: 4.1 mmol/L (ref 3.5–5.3)
Sodium: 139 mmol/L (ref 135–146)
Total Bilirubin: 1.2 mg/dL (ref 0.2–1.2)
Total Protein: 6.4 g/dL (ref 6.1–8.1)

## 2019-08-27 LAB — LIPID PANEL W/REFLEX DIRECT LDL
Cholesterol: 159 mg/dL (ref <200)
HDL: 67 mg/dL (ref >=50)
LDL Cholesterol (Calc): 79 mg/dL (calc)
Non-HDL Cholesterol (Calc): 92 mg/dL (calc) (ref <130)
Total CHOL/HDL Ratio: 2.4 (calc) (ref <5.0)
Triglycerides: 46 mg/dL (ref <150)

## 2019-08-27 LAB — CBC WITH DIFFERENTIAL/PLATELET
Absolute Monocytes: 371 cells/uL (ref 200–950)
Basophils Absolute: 80 cells/uL (ref 0–200)
Basophils Relative: 1.7 %
Eosinophils Absolute: 423 cells/uL (ref 15–500)
Eosinophils Relative: 9 %
HCT: 41.9 % (ref 35.0–45.0)
Hemoglobin: 14 g/dL (ref 11.7–15.5)
Lymphs Abs: 1706 cells/uL (ref 850–3900)
MCH: 30.3 pg (ref 27.0–33.0)
MCHC: 33.4 g/dL (ref 32.0–36.0)
MCV: 90.7 fL (ref 80.0–100.0)
MPV: 10.7 fL (ref 7.5–12.5)
Monocytes Relative: 7.9 %
Neutro Abs: 2120 cells/uL (ref 1500–7800)
Neutrophils Relative %: 45.1 %
Platelets: 201 10*3/uL (ref 140–400)
RBC: 4.62 10*6/uL (ref 3.80–5.10)
RDW: 11.8 % (ref 11.0–15.0)
Total Lymphocyte: 36.3 %
WBC: 4.7 10*3/uL (ref 3.8–10.8)

## 2019-08-27 LAB — TSH: TSH: 14.77 mIU/L — ABNORMAL HIGH

## 2019-08-28 ENCOUNTER — Encounter: Payer: Self-pay | Admitting: Physician Assistant

## 2019-08-28 DIAGNOSIS — E039 Hypothyroidism, unspecified: Secondary | ICD-10-CM

## 2019-08-28 MED ORDER — LEVOTHYROXINE SODIUM 137 MCG PO TABS: 137.0000 ug | ORAL_TABLET | Freq: Every day | ORAL | 1 refills | Status: DC

## 2019-08-28 NOTE — Telephone Encounter (Signed)
Kellee,   Cholesterol looks GREAT! Kidney, liver, glucose look perfect.  Not anemic.  Thyroid is not to goal. TSH is 14.77 meaning you are in a hypothyroid(low state). You have been taking levothyroxine daily? We do need to make medication adjustments.

## 2019-08-30 ENCOUNTER — Ambulatory Visit (INDEPENDENT_AMBULATORY_CARE_PROVIDER_SITE_OTHER): Payer: BC Managed Care – PPO | Admitting: Psychology

## 2019-08-30 DIAGNOSIS — F411 Generalized anxiety disorder: Secondary | ICD-10-CM | POA: Diagnosis not present

## 2019-09-08 ENCOUNTER — Encounter: Payer: Self-pay | Admitting: Physician Assistant

## 2019-09-13 ENCOUNTER — Ambulatory Visit (INDEPENDENT_AMBULATORY_CARE_PROVIDER_SITE_OTHER): Payer: BC Managed Care – PPO | Admitting: Psychology

## 2019-09-13 DIAGNOSIS — F411 Generalized anxiety disorder: Secondary | ICD-10-CM | POA: Diagnosis not present

## 2019-09-27 ENCOUNTER — Ambulatory Visit: Payer: BC Managed Care – PPO | Admitting: Psychology

## 2019-10-11 ENCOUNTER — Ambulatory Visit: Payer: BC Managed Care – PPO | Admitting: Psychology

## 2019-10-17 DIAGNOSIS — E039 Hypothyroidism, unspecified: Secondary | ICD-10-CM | POA: Diagnosis not present

## 2019-10-17 NOTE — Addendum Note (Signed)
Addended by: Jed Limerick on: 10/17/2019 09:29 AM   Modules accepted: Orders

## 2019-10-18 LAB — TSH: TSH: 6.71 mIU/L — ABNORMAL HIGH

## 2019-10-19 ENCOUNTER — Other Ambulatory Visit: Payer: Self-pay | Admitting: Physician Assistant

## 2019-10-19 ENCOUNTER — Other Ambulatory Visit: Payer: Self-pay | Admitting: Neurology

## 2019-10-19 DIAGNOSIS — E039 Hypothyroidism, unspecified: Secondary | ICD-10-CM

## 2019-10-19 MED ORDER — LEVOTHYROXINE SODIUM 150 MCG PO TABS
150.0000 ug | ORAL_TABLET | Freq: Every day | ORAL | 1 refills | Status: DC
Start: 2019-10-19 — End: 2020-01-03

## 2019-10-19 NOTE — Telephone Encounter (Signed)
Maytal,   Thyroid is better but still not to goal. Increased synthroid again. Have labs checked in 6 weeks.

## 2019-10-19 NOTE — Telephone Encounter (Signed)
Makinley,  ° °Thyroid is better but still not to goal. Increased synthroid again. Have labs checked in 6 weeks.

## 2019-10-20 ENCOUNTER — Other Ambulatory Visit: Payer: Self-pay | Admitting: Physician Assistant

## 2019-10-20 DIAGNOSIS — F411 Generalized anxiety disorder: Secondary | ICD-10-CM

## 2019-10-20 DIAGNOSIS — F410 Panic disorder [episodic paroxysmal anxiety] without agoraphobia: Secondary | ICD-10-CM

## 2019-10-20 DIAGNOSIS — F331 Major depressive disorder, recurrent, moderate: Secondary | ICD-10-CM

## 2019-10-30 ENCOUNTER — Ambulatory Visit (INDEPENDENT_AMBULATORY_CARE_PROVIDER_SITE_OTHER): Payer: BC Managed Care – PPO | Admitting: Psychology

## 2019-10-30 DIAGNOSIS — F411 Generalized anxiety disorder: Secondary | ICD-10-CM

## 2019-11-02 ENCOUNTER — Other Ambulatory Visit: Payer: Self-pay | Admitting: Physician Assistant

## 2019-11-14 ENCOUNTER — Ambulatory Visit (INDEPENDENT_AMBULATORY_CARE_PROVIDER_SITE_OTHER): Payer: BC Managed Care – PPO | Admitting: Psychology

## 2019-11-14 DIAGNOSIS — F411 Generalized anxiety disorder: Secondary | ICD-10-CM

## 2019-11-14 DIAGNOSIS — H53143 Visual discomfort, bilateral: Secondary | ICD-10-CM | POA: Diagnosis not present

## 2019-11-14 DIAGNOSIS — H5213 Myopia, bilateral: Secondary | ICD-10-CM | POA: Diagnosis not present

## 2019-11-25 ENCOUNTER — Other Ambulatory Visit: Payer: Self-pay | Admitting: Physician Assistant

## 2019-11-25 DIAGNOSIS — F410 Panic disorder [episodic paroxysmal anxiety] without agoraphobia: Secondary | ICD-10-CM

## 2019-11-25 DIAGNOSIS — F331 Major depressive disorder, recurrent, moderate: Secondary | ICD-10-CM

## 2019-11-25 DIAGNOSIS — F411 Generalized anxiety disorder: Secondary | ICD-10-CM

## 2019-11-27 ENCOUNTER — Ambulatory Visit (INDEPENDENT_AMBULATORY_CARE_PROVIDER_SITE_OTHER): Payer: BC Managed Care – PPO | Admitting: Psychology

## 2019-11-27 DIAGNOSIS — F411 Generalized anxiety disorder: Secondary | ICD-10-CM

## 2019-11-28 ENCOUNTER — Ambulatory Visit: Payer: BC Managed Care – PPO | Admitting: Psychology

## 2019-12-12 ENCOUNTER — Ambulatory Visit: Payer: BC Managed Care – PPO | Admitting: Psychology

## 2019-12-19 ENCOUNTER — Ambulatory Visit (INDEPENDENT_AMBULATORY_CARE_PROVIDER_SITE_OTHER): Payer: BC Managed Care – PPO | Admitting: Psychology

## 2019-12-19 DIAGNOSIS — F411 Generalized anxiety disorder: Secondary | ICD-10-CM | POA: Diagnosis not present

## 2019-12-24 DIAGNOSIS — Z6823 Body mass index (BMI) 23.0-23.9, adult: Secondary | ICD-10-CM | POA: Diagnosis not present

## 2019-12-24 DIAGNOSIS — Z01419 Encounter for gynecological examination (general) (routine) without abnormal findings: Secondary | ICD-10-CM | POA: Diagnosis not present

## 2019-12-24 DIAGNOSIS — R82998 Other abnormal findings in urine: Secondary | ICD-10-CM | POA: Diagnosis not present

## 2019-12-26 ENCOUNTER — Ambulatory Visit: Payer: BC Managed Care – PPO | Admitting: Psychology

## 2019-12-26 ENCOUNTER — Other Ambulatory Visit: Payer: Self-pay | Admitting: Physician Assistant

## 2019-12-26 DIAGNOSIS — F411 Generalized anxiety disorder: Secondary | ICD-10-CM

## 2019-12-26 DIAGNOSIS — F410 Panic disorder [episodic paroxysmal anxiety] without agoraphobia: Secondary | ICD-10-CM

## 2019-12-26 DIAGNOSIS — F331 Major depressive disorder, recurrent, moderate: Secondary | ICD-10-CM

## 2020-01-03 ENCOUNTER — Other Ambulatory Visit: Payer: Self-pay | Admitting: Physician Assistant

## 2020-01-04 ENCOUNTER — Telehealth: Payer: Self-pay | Admitting: Physician Assistant

## 2020-01-04 DIAGNOSIS — F410 Panic disorder [episodic paroxysmal anxiety] without agoraphobia: Secondary | ICD-10-CM

## 2020-01-04 DIAGNOSIS — F331 Major depressive disorder, recurrent, moderate: Secondary | ICD-10-CM

## 2020-01-04 DIAGNOSIS — F411 Generalized anxiety disorder: Secondary | ICD-10-CM

## 2020-01-04 MED ORDER — PAROXETINE HCL 40 MG PO TABS: 40.0000 mg | ORAL_TABLET | ORAL | 0 refills | Status: DC

## 2020-01-04 NOTE — Telephone Encounter (Signed)
PT has annual Physical scheduled on 01/09/20. She requested a refill on her medication. Paxil  Please advise.

## 2020-01-04 NOTE — Addendum Note (Signed)
Addended by: Jomarie Longs on: 01/04/2020 04:46 PM   Modules accepted: Orders

## 2020-01-04 NOTE — Telephone Encounter (Signed)
Sent!

## 2020-01-09 ENCOUNTER — Ambulatory Visit (INDEPENDENT_AMBULATORY_CARE_PROVIDER_SITE_OTHER): Payer: BC Managed Care – PPO | Admitting: Physician Assistant

## 2020-01-09 ENCOUNTER — Other Ambulatory Visit: Payer: Self-pay

## 2020-01-09 ENCOUNTER — Ambulatory Visit (INDEPENDENT_AMBULATORY_CARE_PROVIDER_SITE_OTHER): Payer: BC Managed Care – PPO | Admitting: Psychology

## 2020-01-09 VITALS — BP 108/62 | HR 80 | Ht 59.0 in | Wt 111.0 lb

## 2020-01-09 DIAGNOSIS — F411 Generalized anxiety disorder: Secondary | ICD-10-CM

## 2020-01-09 DIAGNOSIS — Z0000 Encounter for general adult medical examination without abnormal findings: Secondary | ICD-10-CM | POA: Diagnosis not present

## 2020-01-09 DIAGNOSIS — E039 Hypothyroidism, unspecified: Secondary | ICD-10-CM

## 2020-01-09 DIAGNOSIS — Z23 Encounter for immunization: Secondary | ICD-10-CM

## 2020-01-09 DIAGNOSIS — F331 Major depressive disorder, recurrent, moderate: Secondary | ICD-10-CM

## 2020-01-09 DIAGNOSIS — F410 Panic disorder [episodic paroxysmal anxiety] without agoraphobia: Secondary | ICD-10-CM

## 2020-01-09 DIAGNOSIS — Z13220 Encounter for screening for lipoid disorders: Secondary | ICD-10-CM

## 2020-01-09 DIAGNOSIS — Z131 Encounter for screening for diabetes mellitus: Secondary | ICD-10-CM

## 2020-01-09 MED ORDER — LEVOTHYROXINE SODIUM 150 MCG PO TABS: 150.0000 ug | ORAL_TABLET | Freq: Every day | ORAL | 3 refills | Status: DC

## 2020-01-09 MED ORDER — BUSPIRONE HCL 7.5 MG PO TABS: 7.5000 mg | ORAL_TABLET | Freq: Two times a day (BID) | ORAL | 3 refills | Status: AC

## 2020-01-09 MED ORDER — PAROXETINE HCL 40 MG PO TABS: 40.0000 mg | ORAL_TABLET | ORAL | 3 refills | Status: DC

## 2020-01-09 NOTE — Patient Instructions (Signed)
Health Maintenance, Female Adopting a healthy lifestyle and getting preventive care are important in promoting health and wellness. Ask your health care provider about:  The right schedule for you to have regular tests and exams.  Things you can do on your own to prevent diseases and keep yourself healthy. What should I know about diet, weight, and exercise? Eat a healthy diet   Eat a diet that includes plenty of vegetables, fruits, low-fat dairy products, and lean protein.  Do not eat a lot of foods that are high in solid fats, added sugars, or sodium. Maintain a healthy weight Body mass index (BMI) is used to identify weight problems. It estimates body fat based on height and weight. Your health care provider can help determine your BMI and help you achieve or maintain a healthy weight. Get regular exercise Get regular exercise. This is one of the most important things you can do for your health. Most adults should:  Exercise for at least 150 minutes each week. The exercise should increase your heart rate and make you sweat (moderate-intensity exercise).  Do strengthening exercises at least twice a week. This is in addition to the moderate-intensity exercise.  Spend less time sitting. Even light physical activity can be beneficial. Watch cholesterol and blood lipids Have your blood tested for lipids and cholesterol at 31 years of age, then have this test every 5 years. Have your cholesterol levels checked more often if:  Your lipid or cholesterol levels are high.  You are older than 31 years of age.  You are at high risk for heart disease. What should I know about cancer screening? Depending on your health history and family history, you may need to have cancer screening at various ages. This may include screening for:  Breast cancer.  Cervical cancer.  Colorectal cancer.  Skin cancer.  Lung cancer. What should I know about heart disease, diabetes, and high blood  pressure? Blood pressure and heart disease  High blood pressure causes heart disease and increases the risk of stroke. This is more likely to develop in people who have high blood pressure readings, are of African descent, or are overweight.  Have your blood pressure checked: ? Every 3-5 years if you are 18-39 years of age. ? Every year if you are 40 years old or older. Diabetes Have regular diabetes screenings. This checks your fasting blood sugar level. Have the screening done:  Once every three years after age 40 if you are at a normal weight and have a low risk for diabetes.  More often and at a younger age if you are overweight or have a high risk for diabetes. What should I know about preventing infection? Hepatitis B If you have a higher risk for hepatitis B, you should be screened for this virus. Talk with your health care provider to find out if you are at risk for hepatitis B infection. Hepatitis C Testing is recommended for:  Everyone born from 1945 through 1965.  Anyone with known risk factors for hepatitis C. Sexually transmitted infections (STIs)  Get screened for STIs, including gonorrhea and chlamydia, if: ? You are sexually active and are younger than 31 years of age. ? You are older than 31 years of age and your health care provider tells you that you are at risk for this type of infection. ? Your sexual activity has changed since you were last screened, and you are at increased risk for chlamydia or gonorrhea. Ask your health care provider if   you are at risk.  Ask your health care provider about whether you are at high risk for HIV. Your health care provider may recommend a prescription medicine to help prevent HIV infection. If you choose to take medicine to prevent HIV, you should first get tested for HIV. You should then be tested every 3 months for as long as you are taking the medicine. Pregnancy  If you are about to stop having your period (premenopausal) and  you may become pregnant, seek counseling before you get pregnant.  Take 400 to 800 micrograms (mcg) of folic acid every day if you become pregnant.  Ask for birth control (contraception) if you want to prevent pregnancy. Osteoporosis and menopause Osteoporosis is a disease in which the bones lose minerals and strength with aging. This can result in bone fractures. If you are 65 years old or older, or if you are at risk for osteoporosis and fractures, ask your health care provider if you should:  Be screened for bone loss.  Take a calcium or vitamin D supplement to lower your risk of fractures.  Be given hormone replacement therapy (HRT) to treat symptoms of menopause. Follow these instructions at home: Lifestyle  Do not use any products that contain nicotine or tobacco, such as cigarettes, e-cigarettes, and chewing tobacco. If you need help quitting, ask your health care provider.  Do not use street drugs.  Do not share needles.  Ask your health care provider for help if you need support or information about quitting drugs. Alcohol use  Do not drink alcohol if: ? Your health care provider tells you not to drink. ? You are pregnant, may be pregnant, or are planning to become pregnant.  If you drink alcohol: ? Limit how much you use to 0-1 drink a day. ? Limit intake if you are breastfeeding.  Be aware of how much alcohol is in your drink. In the [place] one drink equals one 12 oz bottle of beer (355 mL), one 5 oz glass of wine (148 mL), or one 1 oz glass of hard liquor (44 mL). General instructions  Schedule regular health, dental, and eye exams.  Stay current with your vaccines.  Tell your health care provider if: ? You often feel depressed. ? You have ever been abused or do not feel safe at home. Summary  Adopting a healthy lifestyle and getting preventive care are important in promoting health and wellness.  Follow your health care provider's instructions about healthy  diet, exercising, and getting tested or screened for diseases.  Follow your health care provider's instructions on monitoring your cholesterol and blood pressure. This information is not intended to replace advice given to you by your health care provider. Make sure you discuss any questions you have with your health care provider. Document Revised: 02/22/2018 Document Reviewed: 02/22/2018 Elsevier Patient Education  2020 Elsevier Inc.  

## 2020-01-09 NOTE — Progress Notes (Signed)
Subjective:     Heidi Levy is a 29 y.o. female and is here for a comprehensive physical exam. The patient reports no problems.  Social History   Socioeconomic History  . Marital status: Married    Spouse name: Not on file  . Number of children: Not on file  . Years of education: Not on file  . Highest education level: Not on file  Occupational History  . Not on file  Tobacco Use  . Smoking status: Never Smoker  . Smokeless tobacco: Never Used  Substance and Sexual Activity  . Alcohol use: Yes    Alcohol/week: 2.0 - 3.0 standard drinks    Types: 2 - 3 Standard drinks or equivalent per week  . Drug use: No  . Sexual activity: Yes    Birth control/protection: Pill  Other Topics Concern  . Not on file  Social History Narrative  . Not on file   Social Determinants of Health   Financial Resource Strain:   . Difficulty of Paying Living Expenses: Not on file  Food Insecurity:   . Worried About Programme researcher, broadcasting/film/video in the Last Year: Not on file  . Ran Out of Food in the Last Year: Not on file  Transportation Needs:   . Lack of Transportation (Medical): Not on file  . Lack of Transportation (Non-Medical): Not on file  Physical Activity:   . Days of Exercise per Week: Not on file  . Minutes of Exercise per Session: Not on file  Stress:   . Feeling of Stress : Not on file  Social Connections:   . Frequency of Communication with Friends and Family: Not on file  . Frequency of Social Gatherings with Friends and Family: Not on file  . Attends Religious Services: Not on file  . Active Member of Clubs or Organizations: Not on file  . Attends Banker Meetings: Not on file  . Marital Status: Not on file  Intimate Partner Violence:   . Fear of Current or Ex-Partner: Not on file  . Emotionally Abused: Not on file  . Physically Abused: Not on file  . Sexually Abused: Not on file   Health Maintenance  Topic Date Due  . Hepatitis C Screening  Never done   . HIV Screening  Never done  . PAP SMEAR-Modifier  05/16/2020  . TETANUS/TDAP  07/30/2025  . INFLUENZA VACCINE  Completed  . COVID-19 Vaccine  Completed    The following portions of the patient's history were reviewed and updated as appropriate: allergies, current medications, past family history, past medical history, past social history, past surgical history and problem list.  Review of Systems A comprehensive review of systems was negative.   Objective:    BP 108/62   Pulse 80   Ht 4\' 11"  (1.499 m)   Wt 111 lb (50.3 kg)   SpO2 99%   BMI 22.42 kg/m  General appearance: alert, cooperative and appears stated age Head: Normocephalic, without obvious abnormality, atraumatic Eyes: conjunctivae/corneas clear. PERRL, EOM's intact. Fundi benign. Ears: normal TM's and external ear canals both ears Nose: Nares normal. Septum midline. Mucosa normal. No drainage or sinus tenderness. Throat: lips, mucosa, and tongue normal; teeth and gums normal Neck: no adenopathy, no carotid bruit, no JVD, supple, symmetrical, trachea midline and thyroid not enlarged, symmetric, no tenderness/mass/nodules Back: symmetric, no curvature. ROM normal. No CVA tenderness. Lungs: clear to auscultation bilaterally Heart: regular rate and rhythm, S1, S2 normal, no murmur, click, rub or gallop  Abdomen: soft, non-tender; bowel sounds normal; no masses,  no organomegaly Extremities: extremities normal, atraumatic, no cyanosis or edema Pulses: 2+ and symmetric Skin: Skin color, texture, turgor normal. No rashes or lesions Lymph nodes: Cervical, supraclavicular, and axillary nodes normal. Neurologic: Alert and oriented X 3, normal strength and tone. Normal symmetric reflexes. Normal coordination and gait    .[name] Depression screen [facility] 2/9 01/09/2020 05/01/2019 08/15/2018 04/22/2017 12/19/2015  Decreased Interest 1 1 1  0 1  Down, Depressed, Hopeless 1 1 1 1 1   PHQ - 2 Score 2 2 2 1 2   Altered sleeping 0 0 1 0 2  Tired,  decreased energy 1 3 1 1 1   Change in appetite 0 3 0 0 0  Feeling bad or failure about yourself  1 1 1  0 1  Trouble concentrating 0 3 1 1  0  Moving slowly or fidgety/restless 0 0 0 0 0  Suicidal thoughts 0 0 0 0 0  PHQ-9 Score 4 12 6 3 6   Difficult doing work/chores Not difficult at all Somewhat difficult Somewhat difficult Not difficult at all -   .. GAD 7 : Generalized Anxiety Score 01/09/2020 05/01/2019 08/15/2018 04/22/2017  Nervous, Anxious, on Edge 1 3 3  0  Control/stop worrying 0 1 3 0  Worry too much - different things 1 1 3 1   Trouble relaxing 0 3 3 0  Restless 0 0 2 0  Easily annoyed or irritable 1 1 1  0  Afraid - awful might happen 0 1 1 0  Total GAD 7 Score 3 10 16 1   Anxiety Difficulty Not difficult at all Not difficult at all Not difficult at all Not difficult at all     Assessment:    Healthy female exam.      Plan:     Heidi Levy was seen today for annual exam.  Diagnoses and all orders for this visit:  Routine physical examination -     TSH -     COMPLETE METABOLIC PANEL WITH GFR -     Lipid Panel w/reflex Direct LDL  Flu vaccine need -     Flu Vaccine QUAD 36+ mos IM  GAD (generalized anxiety disorder) -     PARoxetine (PAXIL) 40 MG tablet; Take 1 tablet (40 mg total) by mouth every morning. -     busPIRone (BUSPAR) 7.5 MG tablet; Take 1 tablet (7.5 mg total) by mouth 2 (two) times daily.  Panic attacks -     PARoxetine (PAXIL) 40 MG tablet; Take 1 tablet (40 mg total) by mouth every morning. -     busPIRone (BUSPAR) 7.5 MG tablet; Take 1 tablet (7.5 mg total) by mouth 2 (two) times daily.  Moderate episode of recurrent major depressive disorder (HCC) -     PARoxetine (PAXIL) 40 MG tablet; Take 1 tablet (40 mg total) by mouth every morning.  Hypothyroidism, unspecified type -     TSH -     levothyroxine (SYNTHROID) 150 MCG tablet; Take 1 tablet (150 mcg total) by mouth daily before breakfast.  Screening for lipid disorders -     Lipid Panel  w/reflex Direct LDL  Screening for diabetes mellitus -     COMPLETE METABOLIC PANEL WITH GFR   .01/11/2020 Discussed 150 minutes of exercise a week.  Encouraged vitamin D 1000 units and Calcium 1300mg  or 4 servings of dairy a day.  Pap UTD. Fasting labs ordered.  PHQ/GAD numbers great. Refills sent. She continues counseling.  Covid and flu shot UTD.  Will refill levothyroxine accordingly.   See After Visit Summary for Counseling Recommendations

## 2020-01-11 ENCOUNTER — Encounter: Payer: Self-pay | Admitting: Physician Assistant

## 2020-01-23 ENCOUNTER — Ambulatory Visit (INDEPENDENT_AMBULATORY_CARE_PROVIDER_SITE_OTHER): Payer: BC Managed Care – PPO | Admitting: Psychology

## 2020-01-23 DIAGNOSIS — F411 Generalized anxiety disorder: Secondary | ICD-10-CM | POA: Diagnosis not present

## 2020-02-04 ENCOUNTER — Ambulatory Visit: Payer: BC Managed Care – PPO | Admitting: Psychology

## 2020-04-25 DIAGNOSIS — Z13220 Encounter for screening for lipoid disorders: Secondary | ICD-10-CM | POA: Diagnosis not present

## 2020-04-25 DIAGNOSIS — Z0000 Encounter for general adult medical examination without abnormal findings: Secondary | ICD-10-CM | POA: Diagnosis not present

## 2020-04-25 DIAGNOSIS — Z131 Encounter for screening for diabetes mellitus: Secondary | ICD-10-CM | POA: Diagnosis not present

## 2020-04-25 DIAGNOSIS — E039 Hypothyroidism, unspecified: Secondary | ICD-10-CM | POA: Diagnosis not present

## 2020-04-25 LAB — COMPLETE METABOLIC PANEL WITH GFR
AG Ratio: 2 (calc) (ref 1.0–2.5)
ALT: 8 U/L (ref 6–29)
AST: 15 U/L (ref 10–30)
Albumin: 4.5 g/dL (ref 3.6–5.1)
Alkaline phosphatase (APISO): 45 U/L (ref 31–125)
BUN: 12 mg/dL (ref 7–25)
CO2: 27 mmol/L (ref 20–32)
Calcium: 9.6 mg/dL (ref 8.6–10.2)
Chloride: 106 mmol/L (ref 98–110)
Creat: 0.8 mg/dL (ref 0.50–1.10)
GFR, Est African American: 113 mL/min/{1.73_m2} (ref >=60)
GFR, Est Non African American: 98 mL/min/{1.73_m2} (ref >=60)
Globulin: 2.2 g/dL (calc) (ref 1.9–3.7)
Glucose, Bld: 79 mg/dL (ref 65–99)
Potassium: 4.4 mmol/L (ref 3.5–5.3)
Sodium: 140 mmol/L (ref 135–146)
Total Bilirubin: 1 mg/dL (ref 0.2–1.2)
Total Protein: 6.7 g/dL (ref 6.1–8.1)

## 2020-04-25 LAB — LIPID PANEL W/REFLEX DIRECT LDL
Cholesterol: 177 mg/dL (ref <200)
HDL: 74 mg/dL (ref >=50)
LDL Cholesterol (Calc): 89 mg/dL (calc)
Non-HDL Cholesterol (Calc): 103 mg/dL (calc) (ref <130)
Total CHOL/HDL Ratio: 2.4 (calc) (ref <5.0)
Triglycerides: 47 mg/dL (ref <150)

## 2020-04-25 LAB — TSH: TSH: 27.04 mIU/L — ABNORMAL HIGH

## 2020-04-28 NOTE — Progress Notes (Signed)
WoW TSH is back up at 27. Are you taking your medication daily in the morning without food and not missing any doses? If you ware we need to increase dose again. How do you feel? Are you having the low thyroid symptoms?

## 2020-06-06 ENCOUNTER — Encounter: Payer: Self-pay | Admitting: Physician Assistant

## 2020-06-12 NOTE — Telephone Encounter (Signed)
Printed, completed, and waiting on Alice Vitelli's signature.

## 2020-06-16 NOTE — Telephone Encounter (Signed)
Forms completed and faxed with confirmation received.

## 2020-07-16 ENCOUNTER — Ambulatory Visit (INDEPENDENT_AMBULATORY_CARE_PROVIDER_SITE_OTHER): Payer: BC Managed Care – PPO

## 2020-07-16 ENCOUNTER — Other Ambulatory Visit: Payer: Self-pay

## 2020-07-16 ENCOUNTER — Encounter: Payer: Self-pay | Admitting: Physician Assistant

## 2020-07-16 ENCOUNTER — Ambulatory Visit: Payer: BC Managed Care – PPO | Admitting: Physician Assistant

## 2020-07-16 VITALS — BP 115/73 | HR 82 | Ht 59.0 in | Wt 114.0 lb

## 2020-07-16 DIAGNOSIS — M79644 Pain in right finger(s): Secondary | ICD-10-CM

## 2020-07-16 DIAGNOSIS — M79645 Pain in left finger(s): Secondary | ICD-10-CM

## 2020-07-16 DIAGNOSIS — E039 Hypothyroidism, unspecified: Secondary | ICD-10-CM

## 2020-07-16 DIAGNOSIS — F331 Major depressive disorder, recurrent, moderate: Secondary | ICD-10-CM | POA: Diagnosis not present

## 2020-07-16 DIAGNOSIS — F411 Generalized anxiety disorder: Secondary | ICD-10-CM

## 2020-07-16 DIAGNOSIS — R2231 Localized swelling, mass and lump, right upper limb: Secondary | ICD-10-CM

## 2020-07-16 NOTE — Progress Notes (Signed)
Subjective:    Patient ID: Heidi Levy, female    DOB: 1990-11-02, 30 y.o.   MRN: [id number]  HPI  Pt is a 30 yo female with hypothyroidism, MDD, GAD who presents to the clinic for follow up.   Taking levothyroxine daily. No concerns. Last TSH was 27.   Anxiety and depression controlled. No concerns.   She does have a nodule of her right PIP of index finger. Started a few weeks ago. It was originally more painful and now more just persistent. No known trauma. At times joints feel a little "stiff" in the mornings.     .. Active Ambulatory Problems    Diagnosis Date Noted  . Panic attacks 05/10/2014  . GAD (generalized anxiety disorder) 05/10/2014  . Palpitations 05/10/2014  . Elevated blood pressure 06/19/2014  . Essential hypertension, benign 06/20/2014  . Hypothyroidism 01/15/2015  . Moderate episode of recurrent major depressive disorder (HCC) 05/01/2019  . Nasal vestibulitis 06/20/2019  . Finger pain, right 07/18/2020  . Nodule of finger, right 07/18/2020   Resolved Ambulatory Problems    Diagnosis Date Noted  . No Resolved Ambulatory Problems   No Additional Past Medical History      Review of Systems See HPI.     Objective:   Physical Exam Vitals reviewed.  Constitutional:      Appearance: Normal appearance.  Cardiovascular:     Rate and Rhythm: Normal rate and regular rhythm.     Pulses: Normal pulses.     Heart sounds: Normal heart sounds.  Musculoskeletal:     Comments: Right PIP of index finger nodule firm slightly tender with no surrounding edema or redness.   Neurological:     General: No focal deficit present.     Mental Status: She is alert and oriented to person, place, and time.  Psychiatric:        Mood and Affect: Mood normal.     .. Depression screen [facility] 2/9 01/09/2020 05/01/2019 08/15/2018 04/22/2017 12/19/2015  Decreased Interest 1 1 1  0 1  Down, Depressed, Hopeless 1 1 1 1 1   PHQ - 2 Score 2 2 2 1 2   Altered sleeping 0 0 1  0 2  Tired, decreased energy 1 3 1 1 1   Change in appetite 0 3 0 0 0  Feeling bad or failure about yourself  1 1 1  0 1  Trouble concentrating 0 3 1 1  0  Moving slowly or fidgety/restless 0 0 0 0 0  Suicidal thoughts 0 0 0 0 0  PHQ-9 Score 4 12 6 3 6   Difficult doing work/chores Not difficult at all Somewhat difficult Somewhat difficult Not difficult at all -   .. GAD 7 : Generalized Anxiety Score 01/09/2020 05/01/2019 08/15/2018 04/22/2017  Nervous, Anxious, on Edge 1 3 3  0  Control/stop worrying 0 1 3 0  Worry too much - different things 1 1 3 1   Trouble relaxing 0 3 3 0  Restless 0 0 2 0  Easily annoyed or irritable 1 1 1  0  Afraid - awful might happen 0 1 1 0  Total GAD 7 Score 3 10 16 1   Anxiety Difficulty Not difficult at all Not difficult at all Not difficult at all Not difficult at all          Assessment & Plan:   [name] was seen today for hand problem.  Diagnoses and all orders for this visit:  Hypothyroidism, unspecified type -     TSH  Moderate  episode of recurrent major depressive disorder (HCC)  GAD (generalized anxiety disorder)  Finger pain, right -     DG Finger Index Right; Future -     ANA -     Rheumatoid factor -     Cyclic citrul peptide antibody, IgG  Nodule of finger, right   Recheck TSH and adjust accordingly.   PHQ/GAD stable. Continue meds.   Unclear etiology. Will get xray and autoimmune panel. Ice and NsAIDs as needed.

## 2020-07-17 ENCOUNTER — Other Ambulatory Visit: Payer: Self-pay | Admitting: Physician Assistant

## 2020-07-17 MED ORDER — LEVOTHYROXINE SODIUM 175 MCG PO TABS: 175.0000 ug | ORAL_TABLET | Freq: Every day | ORAL | 0 refills | Status: DC

## 2020-07-17 NOTE — Progress Notes (Signed)
Breyona,   Your xray has not resulted yet.   Your TSH is much better but still not to goal. One more increase and I think we will be there.   Rheumatoid factor negative.

## 2020-07-18 DIAGNOSIS — R2231 Localized swelling, mass and lump, right upper limb: Secondary | ICD-10-CM | POA: Insufficient documentation

## 2020-07-18 DIAGNOSIS — M79644 Pain in right finger(s): Secondary | ICD-10-CM | POA: Insufficient documentation

## 2020-07-18 LAB — ANA: Anti Nuclear Antibody (ANA): POSITIVE — AB

## 2020-07-18 LAB — ANTI-NUCLEAR AB-TITER (ANA TITER): ANA Titer 1: 1:320 {titer} — ABNORMAL HIGH

## 2020-07-18 LAB — CYCLIC CITRUL PEPTIDE ANTIBODY, IGG: Cyclic Citrullin Peptide Ab: <16 UNITS

## 2020-07-18 LAB — TSH: TSH: 7.57 mIU/L — ABNORMAL HIGH

## 2020-07-18 LAB — RHEUMATOID FACTOR: Rheumatoid fact SerPl-aCnc: <14 IU/mL (ref <14)

## 2020-07-18 NOTE — Progress Notes (Signed)
No acute osseous finding. No soft tissue finding. It could be a small bouchard node.  I would not be concerned unless it started hurting or getting more.

## 2020-08-20 ENCOUNTER — Other Ambulatory Visit: Payer: Self-pay

## 2020-08-20 ENCOUNTER — Encounter (HOSPITAL_BASED_OUTPATIENT_CLINIC_OR_DEPARTMENT_OTHER): Payer: Self-pay | Admitting: Physician Assistant

## 2020-08-20 ENCOUNTER — Ambulatory Visit (HOSPITAL_BASED_OUTPATIENT_CLINIC_OR_DEPARTMENT_OTHER): Payer: BC Managed Care – PPO

## 2020-08-20 ENCOUNTER — Ambulatory Visit: Payer: BC Managed Care – PPO | Attending: Physician Assistant | Admitting: Physician Assistant

## 2020-08-20 VITALS — BP 108/58 | HR 82 | Temp 98.9°F | Ht 58.5 in | Wt 115.7 lb

## 2020-08-20 DIAGNOSIS — E039 Hypothyroidism, unspecified: Secondary | ICD-10-CM

## 2020-08-20 DIAGNOSIS — F410 Panic disorder [episodic paroxysmal anxiety] without agoraphobia: Secondary | ICD-10-CM | POA: Insufficient documentation

## 2020-08-20 DIAGNOSIS — Z0000 Encounter for general adult medical examination without abnormal findings: Secondary | ICD-10-CM | POA: Insufficient documentation

## 2020-08-20 DIAGNOSIS — F411 Generalized anxiety disorder: Secondary | ICD-10-CM | POA: Insufficient documentation

## 2020-08-20 LAB — THYROXINE, FREE (FREE T4): THYROXINE (T4), FREE: 1.11 ng/dL (ref 0.70–1.25)

## 2020-08-20 LAB — THYROID STIMULATING HORMONE (SENSITIVE TSH): TSH: 0.727 u[IU]/mL (ref 0.430–3.550)

## 2020-08-20 MED ORDER — PROPRANOLOL 10 MG TABLET
10.0000 mg | ORAL_TABLET | Freq: Three times a day (TID) | ORAL | 0 refills | Status: DC
Start: 2020-08-20 — End: 2020-09-16

## 2020-08-20 NOTE — Progress Notes (Signed)
Department of Family Medicine   History and Physical      Nichole [name]  MRN: [id number]  DOB: 1990-10-24  Date of Service: 08/20/2020    CHIEF COMPLAINT  Chief Complaint   Patient presents with   . New Patient       HISTORY OF PRESENT ILLNESS  Nichole [name] is a 30 y.o. female presenting to clinic to establish care.     Past medical history is positive for hypothyroidism and generalized anxiety disorder.  Patient was diagnosed with hypothyroidism as a toddler.  States that her levels have been fluctuating over the last few years.  Denies any obvious symptoms, though she feels her anxiety makes it difficult to differentiate.  Her last check was with her PCP in [place] about 3 months ago which is when her Synthroid was increased from 150 mcg to 175 mcg.    Her anxiety is currently being managed with Paxil and BuSpar.  She is initially only on BusPar and did not feel her symptoms were well managed.  She was started on Paxil about a year ago and feels that it has worked very well for her.  She had previously tried Lexapro, hydroxyzine, Wellbutrin, and Effexor with little success.  She does not feel that she notices much of a difference when taking the BuSpar when she does not take it.  Notes that she still has some breakthrough panic episodes, but overall they are very much improved.  Her triggers are mostly social like public speaking and 1 V1 interaction with people.      PAST MEDICAL HISTORY  Past Medical History:   Diagnosis Date   . Anxiety    . Hypertension    . Hypothyroidism          MEDICATIONS  busPIRone (BUSPAR) 7.5 mg Oral Tablet, Take 7.5 mg by mouth Twice daily  levothyroxine (SYNTHROID) 175 mcg Oral Tablet, Take 175 mcg by mouth Every morning  PARoxetine (PAXIL) 40 mg Oral Tablet, Take 40 mg by mouth Every morning    No facility-administered medications prior to visit.      ALLERGIES  Allergies   Allergen Reactions   . Benzoyl Peroxide Itching, Photosensitivity, Rash and Swelling       PAST  SURGICAL HISTORY  Past Surgical History:   Procedure Laterality Date   . HX WISDOM TEETH EXTRACTION             IMMUNIZATIONS    There is no immunization history on file for this patient.    FAMILY HISTORY  Family Medical History:     Problem Relation (Age of Onset)    Anxiety Mother    Diabetes type II Father    Healthy Brother    Hypertension (High Blood Pressure) Mother, Father, Maternal Grandfather, Paternal Grandmother, Paternal Grandfather    Osteoporosis Maternal Grandmother    Prostate Cancer Maternal Grandfather    Thyroid Disease Father, Paternal Grandmother            SOCIAL HISTORY  Social History     Socioeconomic History   . Marital status: Married   Tobacco Use   . Smoking status: Current Some Day Smoker   . Smokeless tobacco: Never Used   Vaping Use   . Vaping Use: Never used       REVIEW OF SYSTEMS  Positive ROS discussed in HPI, otherwise all other systems negative.      PHYSICAL EXAM  Vitals: Blood pressure (!) 108/58, pulse 82, temperature 37.2 C (98.9 F),  temperature source Thermal Scan, height 1.486 m (4' 10.5"), weight 52.5 kg (115 lb 11.9 oz), SpO2 100 %. Body mass index is 23.77 kg/m.  General: appears stated age, no acute distress  HEENT: conjunctiva clear; pupils equal and round; normal appearing TMs bilaterally; mouth mucus membranes moist; pharynx appears normal without exudate or erythema  Neck: no thyromegaly or lymphadenopathy  Cardiovascular: RRR, no murmur, no carotid bruits  Lungs: clear to auscultation bilaterally  Abdomen: soft, non-tender, bowel sounds normal  Extremities: no cyanosis or edema  Skin: no rashes or lesions  Neurologic: gait normal, CN 2-12 grossly intact, AOx3  Psychiatric: normal affect and behavior    ASSESSMENT AND PLAN  (Z00.00) Annual physical exam  (primary encounter diagnosis)  Plan:   Health maintenance reviewed   Medications reviewed and reconciled    (F41.1) GAD (generalized anxiety disorder)  (F41.0) Panic disorder  Plan:   Chronic, stable  Discussed  trial of alternative adjunct therapy to Paxil for improved management of symptoms  To discontinue BuSpar  Trial of propranolol 10 mg t.i.d. p.r.n.  Advised on side effects and concern for affect on blood pressure  Patient to discontinue therapy if experiencing symptomatic hypotension  Will follow-up in 4 weeks    (E03.9) Hypothyroidism, unspecified type  Plan:   Chronic, asymptomatic at this time  Will repeat thyroid levels today given recent dose change  Continue Synthroid 175 mcg for time being  TSH, THYROXINE, FREE (FREE T4)        Health Maintanence    BMI Screening   Appropriate range based off of the patient's age is 18.5-24.9.   BMI addressed: At goal    Wt Readings from Last 3 Encounters:   08/20/20 52.5 kg (115 lb 11.9 oz)      Discussed healthy diet, regular exercise, appropriate weight loss, and referral to dietician.     Type II DM Screening   No results found for: GLUCOSEFAST   No results found for: HA1C    Blood Pressure Screening   Goal is < 140/90    Elevated Blood Pressure Plan of Care:  at goal      Depression Screening  PHQ Questionnaire  Little interest or pleasure in doing things.: Not at all  Feeling down, depressed, or hopeless: Not at all  PHQ 2 Total: 0      Cervical Cancer Screening   Last pap smear: last year, normal. Abnormal in her early twenties. Will obtain records.          Orders Placed This Encounter   . TSH   . THYROXINE, FREE (FREE T4)   . propranoloL (INDERAL) 10 mg Oral Tablet       Return in about 4 weeks (around 09/17/2020), or if symptoms worsen or fail to improve.        Ovidio Hanger, PA-C 08/20/2020, 13:18

## 2020-09-16 ENCOUNTER — Other Ambulatory Visit (HOSPITAL_BASED_OUTPATIENT_CLINIC_OR_DEPARTMENT_OTHER): Payer: Self-pay | Admitting: Physician Assistant

## 2020-09-16 DIAGNOSIS — F411 Generalized anxiety disorder: Secondary | ICD-10-CM

## 2020-09-17 ENCOUNTER — Encounter (HOSPITAL_BASED_OUTPATIENT_CLINIC_OR_DEPARTMENT_OTHER): Payer: Self-pay | Admitting: Physician Assistant

## 2020-09-19 ENCOUNTER — Encounter (HOSPITAL_BASED_OUTPATIENT_CLINIC_OR_DEPARTMENT_OTHER): Payer: Self-pay | Admitting: Physician Assistant

## 2020-09-19 ENCOUNTER — Ambulatory Visit: Payer: BC Managed Care – PPO | Attending: Physician Assistant | Admitting: Physician Assistant

## 2020-09-19 DIAGNOSIS — F411 Generalized anxiety disorder: Secondary | ICD-10-CM

## 2020-09-19 NOTE — Progress Notes (Signed)
FAMILY MEDICINE, [facility]  [address]  [place] [address]  Operated by [facility]  Video Visit     Name: Nichole [name]  MRN: [id number]    Date: 09/19/2020  Age: 30 y.o.                            Patient's location: Home [facility] [place] [address]   Patient/family aware of provider location: Yes  Patient/family consent for video visit: Yes  Interview and observation performed by: Ovidio Hanger, PA-C    Chief Complaint: Medication Check F/U    History of Present Illness:  Nichole [name] is a 30 y.o. female following up on medication.    She feels her intrusive thoughts have improved. She is taking propranolol BID currently, but has had to increase to three times daily recently because of recent increase in life stressors. She will be starting school in August. She feels the Paxil is helping. She has not had any side effects.      Past Medical History:  She has a past medical history of Anxiety, Hypertension, and Hypothyroidism.    Past Surgical History:  She has a past surgical history that includes hx wisdom teeth extraction.    Problem List:  She has Hypothyroidism; GAD (generalized anxiety disorder); and Panic disorder on their problem list.    Medications:  .  busPIRone  .  levothyroxine  .  PARoxetine  .  propranoloL     Review of Systems:  Positive ROS discussed in HPI, otherwise all other systems negative.    Observational Exam:   Physical Exam  Constitutional:       Appearance: Normal appearance.   HENT:      Head: Normocephalic and atraumatic.      Nose: No congestion or rhinorrhea.      Mouth/Throat:      Mouth: Mucous membranes are moist.   Eyes:      Extraocular Movements: Extraocular movements intact.      Conjunctiva/sclera: Conjunctivae normal.   Pulmonary:      Effort: Pulmonary effort is normal.   Musculoskeletal:         General: No swelling or deformity.      Cervical back: Normal range of motion and neck supple.   Neurological:      Mental Status:  She is alert and oriented to person, place, and time.   Psychiatric:         Mood and Affect: Mood normal.         Behavior: Behavior normal.         Assessment/Plan:  (F41.1) GAD (generalized anxiety disorder)  (primary encounter diagnosis)  Plan:   Improving  Will continue current regimen of propranolol and Paxil  Will follow up if symptoms acutely worsen         ICD-10-CM    1. GAD (generalized anxiety disorder)  F41.1        Follow Up:  Return in about 3 months (around 12/20/2020), or if symptoms worsen or fail to improve.    Dearia Wilmouth Dalton, PA-C

## 2020-10-04 ENCOUNTER — Other Ambulatory Visit: Payer: Self-pay | Admitting: Physician Assistant

## 2020-11-08 ENCOUNTER — Other Ambulatory Visit: Payer: Self-pay | Admitting: Physician Assistant

## 2020-11-08 DIAGNOSIS — E039 Hypothyroidism, unspecified: Secondary | ICD-10-CM

## 2020-12-07 ENCOUNTER — Other Ambulatory Visit: Payer: Self-pay | Admitting: Physician Assistant

## 2020-12-07 DIAGNOSIS — E039 Hypothyroidism, unspecified: Secondary | ICD-10-CM

## 2020-12-14 ENCOUNTER — Encounter (HOSPITAL_BASED_OUTPATIENT_CLINIC_OR_DEPARTMENT_OTHER): Payer: Self-pay | Admitting: Physician Assistant

## 2020-12-26 ENCOUNTER — Encounter (HOSPITAL_BASED_OUTPATIENT_CLINIC_OR_DEPARTMENT_OTHER): Payer: BC Managed Care – PPO | Admitting: Physician Assistant

## 2020-12-29 ENCOUNTER — Ambulatory Visit (HOSPITAL_BASED_OUTPATIENT_CLINIC_OR_DEPARTMENT_OTHER): Payer: Managed Care, Other (non HMO)

## 2020-12-29 ENCOUNTER — Ambulatory Visit: Payer: Managed Care, Other (non HMO) | Attending: Physician Assistant | Admitting: Physician Assistant

## 2020-12-29 ENCOUNTER — Other Ambulatory Visit: Payer: Self-pay

## 2020-12-29 ENCOUNTER — Encounter (HOSPITAL_BASED_OUTPATIENT_CLINIC_OR_DEPARTMENT_OTHER): Payer: Self-pay | Admitting: Physician Assistant

## 2020-12-29 VITALS — BP 104/72 | HR 89 | Temp 98.9°F | Wt 122.1 lb

## 2020-12-29 DIAGNOSIS — E039 Hypothyroidism, unspecified: Secondary | ICD-10-CM | POA: Insufficient documentation

## 2020-12-29 DIAGNOSIS — F411 Generalized anxiety disorder: Secondary | ICD-10-CM | POA: Insufficient documentation

## 2020-12-29 DIAGNOSIS — Z23 Encounter for immunization: Secondary | ICD-10-CM | POA: Insufficient documentation

## 2020-12-29 DIAGNOSIS — F410 Panic disorder [episodic paroxysmal anxiety] without agoraphobia: Secondary | ICD-10-CM | POA: Insufficient documentation

## 2020-12-29 LAB — THYROID STIMULATING HORMONE WITH FREE T4 REFLEX: TSH: 91.793 u[IU]/mL — ABNORMAL HIGH (ref 0.430–3.550)

## 2020-12-29 LAB — THYROXINE, FREE (FREE T4): THYROXINE (T4), FREE: 0.61 ng/dL — ABNORMAL LOW (ref 0.70–1.25)

## 2020-12-29 NOTE — Patient Instructions (Signed)
Vaccine Information Statement    Influenza (Flu) Vaccine (Inactivated or Recombinant): What You Need to Know    Many vaccine information statements are available in Spanish and other languages. See www.immunize.org/vis.  Hojas de informacin sobre vacunas estn disponibles en espaol y en muchos otros idiomas. Visite www.immunize.org/vis.    1. Why get vaccinated?    Influenza vaccine can prevent influenza (flu).    Flu is a contagious disease that spreads around the [place] every year, usually between October and May. Anyone can get the flu, but it is more dangerous for some people. Infants and young children, people 65 years and older, pregnant people, and people with certain health conditions or a weakened immune system are at greatest risk of flu complications.    Pneumonia, bronchitis, sinus infections, and ear infections are examples of flu-related complications. If you have a medical condition, such as heart disease, cancer, or diabetes, flu can make it worse.    Flu can cause fever and chills, sore throat, muscle aches, fatigue, cough, headache, and runny or stuffy nose. Some people may have vomiting and diarrhea, though this is more common in children than adults.     In an average year, thousands of people in the [place] die from flu, and many more are hospitalized. Flu vaccine prevents millions of illnesses and flu-related visits to the doctor each year.    2. Influenza vaccines     CDC recommends everyone 6 months and older get vaccinated every flu season. Children 6 months through 8 years of age may need 2 doses during a single flu season. Everyone else needs only 1 dose each flu season.    It takes about 2 weeks for protection to develop after vaccination.    There are many flu viruses, and they are always changing. Each year a new flu vaccine is made to protect against the influenza viruses believed to be likely to cause disease in the upcoming flu season. Even when the vaccine doesn't  exactly match these viruses, it may still provide some protection.     Influenza vaccine does not cause flu.    Influenza vaccine may be given at the same time as other vaccines.    3. Talk with your health care provider    Tell your vaccination provider if the person getting the vaccine:  . Has had an allergic reaction after a previous dose of influenza vaccine, or has any severe, life-threatening allergies   . Has ever had Guillain-Barr Syndrome (also called "GBS")    In some cases, your health care provider may decide to postpone influenza vaccination until a future visit.    Influenza vaccine can be administered at any time during pregnancy. People who are or will be pregnant during influenza season should receive inactivated influenza vaccine.    People with minor illnesses, such as a cold, may be vaccinated. People who are moderately or severely ill should usually wait until they recover before getting influenza vaccine.    Your health care provider can give you more information.    4. Risks of a vaccine reaction    . Soreness, redness, and swelling where the shot is given, fever, muscle aches, and headache can happen after influenza vaccination.  . There may be a very small increased risk of Guillain-Barr Syndrome (GBS) after inactivated influenza vaccine (the flu shot).    Young children who get the flu shot along with pneumococcal vaccine (PCV13) and/or DTaP vaccine at the same time might be   slightly more likely to have a seizure caused by fever. Tell your health care provider if a child who is getting flu vaccine has ever had a seizure.    People sometimes faint after medical procedures, including vaccination. Tell your provider if you feel dizzy or have vision changes or ringing in the ears.    As with any medicine, there is a very remote chance of a vaccine causing a severe allergic reaction, other serious injury, or death.    5. What if there is a serious problem?    An allergic reaction could occur  after the vaccinated person leaves the clinic. If you see signs of a severe allergic reaction (hives, swelling of the face and throat, difficulty breathing, a fast heartbeat, dizziness, or weakness), call 9-1-1 and get the person to the nearest hospital.    For other signs that concern you, call your health care provider.    Adverse reactions should be reported to the Vaccine Adverse Event Reporting System (VAERS). Your health care provider will usually file this report, or you can do it yourself. Visit the VAERS website at www.vaers.hhs.gov or call 1-800-822-7967. VAERS is only for reporting reactions, and VAERS staff members do not give medical advice.    6. The National Vaccine Injury Compensation Program    The National Vaccine Injury Compensation Program (VICP) is a federal program that was created to compensate people who may have been injured by certain vaccines. Claims regarding alleged injury or death due to vaccination have a time limit for filing, which may be as short as two years. Visit the VICP website at www.hrsa.gov/vaccinecompensation or call 1-800-338-2382 to learn about the program and about filing a claim.     7. How can I learn more?    . Ask your health care provider.   . Call your local or state health department.   . Visit the website of the Food and Drug Administration (FDA) for vaccine package inserts and additional information at www.fda.gov/vaccines-blood-biologics/vaccines.  . Contact the Centers for Disease Control and Prevention (CDC):  - Call 1-800-232-4636 (1-800-CDC-INFO) or  - Visit CDC's influenza website at www.cdc.gov/flu.    Vaccine Information Statement   Inactivated Influenza Vaccine   10/19/2019  42 U.[place]  [id number]   Department of Health and Human Services  Centers for Disease Control and Prevention    Office Use Only

## 2020-12-29 NOTE — Progress Notes (Addendum)
Department of Family Medicine   Progress Note    Iyahna Obriant  MRN: [id number]  DOB: Aug 02, 1990  Date of Service: 12/29/2020    CHIEF COMPLAINT  Chief Complaint   Patient presents with   . Follow Up       SUBJECTIVE  Nichole [name] is a 30 y.o. female who presents to clinic for 3 month follow up.     Regarding her anxiety, she is having increased anxiety lately due to moving into a new house and starting school. She is taking Paxil 40 mg daily with propanolol 10 mg 2-3 times daily. She is having less break through anxiety. Overall, she is happy with her current regimen.    Regarding hypothyroidism, her levels back in June were normal with her Synthroid 175 mcg. She does complain of slight fatigue, but attributes that to stress. Other than that she is asymptomatic.        Review of Systems:  Positive ROS discussed in HPI, otherwise all other systems negative.      Medications:   levothyroxine (SYNTHROID) 175 mcg Oral Tablet, Take 175 mcg by mouth Every morning  PARoxetine (PAXIL) 40 mg Oral Tablet, Take 40 mg by mouth Every morning  propranoloL (INDERAL) 10 mg Oral Tablet, Take 1 Tablet (10 mg total) by mouth Three times a day    No facility-administered medications prior to visit.      Allergies:   Allergies   Allergen Reactions   . Benzoyl Peroxide Itching, Photosensitivity, Rash and Swelling   . Nickel Rash         OBJECTIVE  BP 104/72 (Site: Left, Patient Position: Sitting, Cuff Size: Adult Small)   Pulse 89   Temp 37.2 C (98.9 F) (Thermal Scan)   Wt 55.4 kg (122 lb 2.2 oz)   SpO2 99%   BMI 25.09 kg/m       General: no distress  HENT: TMs clear, mouth mucous membranes moist, pharynx without injection or exudate   Lungs: clear to auscultation bilaterally  Cardiovascular: RRR, no murmur  Abdomen: soft, non tender, bowel sounds present  Extremities: no cyanosis or edema  Skin: warm and dry, no rash  Neurologic: gait is normal, AOx3, CN 2-12 grossly intact  Psychiatric: normal affect and  behavior    ASSESSMENT/PLAN  (F41.1) GAD (generalized anxiety disorder)  (primary encounter diagnosis)  (F41.0) Panic disorder  Plan:   Chronic, stable  Symptoms controlled on current regimen.    (E03.9) Hypothyroidism, unspecified type  Plan:   Chronic, stable  Asymptomatic  Continue current synthroid dose  Normal levels back in June  Will repeat TSH        Orders Placed This Encounter   . Flu Vaccine, 6 month-adult,0.5 mL IM (Admin)   . THYROID STIMULATING HORMONE WITH FREE T4 REFLEX         Return in about 6 months (around 06/29/2021), or if symptoms worsen or fail to improve.      Vernelle Emerald, STUDENT PHYSICIAN ASSISTANT 12/29/2020, 09:18  I have seen and examined the patient in concert with the PA student as noted above.  The above note has been modified as necessary by me to reflect my own, personal collection/validation of the patient's HPI, patient history, physical examination, and assessment and plan.    Ovidio Hanger, PA-C  12/29/2020, 17:54

## 2020-12-29 NOTE — Nursing Note (Signed)
12/29/20 0835   Gad-7 (Over the last 2 weeks, how often have you been bothered by the following problems?)   Feeling nervous,anxious,on edge 2   Not being able to stop or control worrying 1   Worrying too much about different things 2   Trouble relaxing 3   Being so restless that it is hard to sit still 0   Becoming easily annoyed or irritable 1   Feeling afraid as if something awful might happen 0   How difficult have these problems made it for you to work, take care of things at home, or get along with other people? Somewhat difficult   Gad-7 Score   Gad-7 Score Total 9   Interpretation 5-9, mild anxiety

## 2020-12-29 NOTE — Progress Notes (Signed)
Patient received vaccine in clinic.  Tolerated it well, given VIS sheet and was discharged to home.  Immunization administered     Name Date Dose VIS Date Route    Influenza Vaccine, 6 month-adult 12/29/2020 0.5 mL 10/19/2019 Intramuscular    Site: Right deltoid    Given By: Azzie Roup, RN    Manufacturer: [employer]    Lot: [id number]    NDC: [id number]        Azzie Roup, RN  12/29/2020, 09:20

## 2021-01-09 ENCOUNTER — Encounter: Payer: BC Managed Care – PPO | Admitting: Physician Assistant

## 2021-01-15 ENCOUNTER — Encounter (HOSPITAL_BASED_OUTPATIENT_CLINIC_OR_DEPARTMENT_OTHER): Payer: Self-pay | Admitting: Physician Assistant

## 2021-01-15 NOTE — Telephone Encounter (Signed)
From: Clarnce Flock  To: Ovidio Hanger, PA-C  Sent: 01/15/2021 1:11 PM EDT  Subject: Levothyroxine    Hi I am checking on the status of my Levothyroxine refill. From my last visit I have ran out of medication. I have put in multiple orders through [facility] on [address] to get a refill and they said they are not receiving anything from your office. Whatever the case may be, I need a refill very soon. Thanks.

## 2021-01-16 ENCOUNTER — Ambulatory Visit (HOSPITAL_BASED_OUTPATIENT_CLINIC_OR_DEPARTMENT_OTHER): Payer: Self-pay | Admitting: Physician Assistant

## 2021-01-16 NOTE — Telephone Encounter (Signed)
Regarding: Advise  ----- Message from Gillian Shields sent at 01/16/2021 10:46 AM EDT -----  Ovidio Hanger, PA-C     Hello!  The pt would like to discuss her levothyroxine (SYNTHROID) 175 mcg Oral Tablet. Please advise the pt at [phone]  Thanks!  [employer]

## 2021-01-17 MED ORDER — LEVOTHYROXINE 175 MCG TABLET
175.0000 ug | ORAL_TABLET | Freq: Every morning | ORAL | 3 refills | Status: DC
Start: 2021-01-17 — End: 2021-03-17

## 2021-01-25 ENCOUNTER — Other Ambulatory Visit (HOSPITAL_BASED_OUTPATIENT_CLINIC_OR_DEPARTMENT_OTHER): Payer: Self-pay | Admitting: Physician Assistant

## 2021-01-25 DIAGNOSIS — E039 Hypothyroidism, unspecified: Secondary | ICD-10-CM

## 2021-01-27 ENCOUNTER — Other Ambulatory Visit (HOSPITAL_BASED_OUTPATIENT_CLINIC_OR_DEPARTMENT_OTHER): Payer: Self-pay | Admitting: Physician Assistant

## 2021-01-27 DIAGNOSIS — F411 Generalized anxiety disorder: Secondary | ICD-10-CM

## 2021-03-12 ENCOUNTER — Other Ambulatory Visit: Payer: Self-pay | Admitting: Physician Assistant

## 2021-03-12 DIAGNOSIS — F411 Generalized anxiety disorder: Secondary | ICD-10-CM

## 2021-03-12 DIAGNOSIS — F410 Panic disorder [episodic paroxysmal anxiety] without agoraphobia: Secondary | ICD-10-CM

## 2021-03-12 DIAGNOSIS — F331 Major depressive disorder, recurrent, moderate: Secondary | ICD-10-CM

## 2021-03-17 ENCOUNTER — Other Ambulatory Visit (HOSPITAL_BASED_OUTPATIENT_CLINIC_OR_DEPARTMENT_OTHER): Payer: Managed Care, Other (non HMO)

## 2021-03-17 ENCOUNTER — Other Ambulatory Visit (HOSPITAL_BASED_OUTPATIENT_CLINIC_OR_DEPARTMENT_OTHER): Payer: Self-pay | Admitting: Physician Assistant

## 2021-03-17 ENCOUNTER — Encounter (HOSPITAL_BASED_OUTPATIENT_CLINIC_OR_DEPARTMENT_OTHER): Payer: Self-pay | Admitting: Physician Assistant

## 2021-03-17 ENCOUNTER — Ambulatory Visit: Payer: Managed Care, Other (non HMO) | Attending: Physician Assistant | Admitting: Physician Assistant

## 2021-03-17 ENCOUNTER — Other Ambulatory Visit: Payer: Self-pay

## 2021-03-17 VITALS — BP 122/76 | HR 80 | Temp 98.5°F | Resp 14 | Ht 59.0 in | Wt 115.3 lb

## 2021-03-17 DIAGNOSIS — E039 Hypothyroidism, unspecified: Secondary | ICD-10-CM

## 2021-03-17 DIAGNOSIS — F411 Generalized anxiety disorder: Secondary | ICD-10-CM | POA: Insufficient documentation

## 2021-03-17 DIAGNOSIS — F410 Panic disorder [episodic paroxysmal anxiety] without agoraphobia: Secondary | ICD-10-CM | POA: Insufficient documentation

## 2021-03-17 LAB — THYROXINE, FREE (FREE T4): THYROXINE (T4), FREE: 1.36 ng/dL — ABNORMAL HIGH (ref 0.70–1.25)

## 2021-03-17 LAB — THYROID STIMULATING HORMONE WITH FREE T4 REFLEX: TSH: 0.128 u[IU]/mL — ABNORMAL LOW (ref 0.430–3.550)

## 2021-03-17 MED ORDER — HYDROXYZINE HCL 25 MG TABLET
25.0000 mg | ORAL_TABLET | Freq: Three times a day (TID) | ORAL | 1 refills | Status: AC | PRN
Start: 2021-03-17 — End: ?

## 2021-03-17 MED ORDER — LEVOTHYROXINE 150 MCG TABLET
150.0000 ug | ORAL_TABLET | Freq: Every morning | ORAL | 1 refills | Status: DC
Start: 2021-03-17 — End: 2021-09-16

## 2021-03-17 MED ORDER — PROPRANOLOL 10 MG TABLET
20.0000 mg | ORAL_TABLET | Freq: Three times a day (TID) | ORAL | 3 refills | Status: DC
Start: 2021-03-17 — End: 2022-09-20

## 2021-03-17 NOTE — Nursing Note (Signed)
03/17/21 0759   Gad-7 (Over the last 2 weeks, how often have you been bothered by the following problems?)   Feeling nervous,anxious,on edge 3   Not being able to stop or control worrying 3   Worrying too much about different things 3   Trouble relaxing 3   Being so restless that it is hard to sit still 0   Becoming easily annoyed or irritable 1   Feeling afraid as if something awful might happen 3   How difficult have these problems made it for you to work, take care of things at home, or get along with other people? Somewhat difficult   Gad-7 Score   Gad-7 Score Total 16   Interpretation 15 or more, severe anxiety

## 2021-03-17 NOTE — Progress Notes (Signed)
Department of Family Medicine   Progress Note    Nichole [name]  MRN: [id number]  DOB: 1990/12/15  Date of Service: 03/17/2021    CHIEF COMPLAINT  Chief Complaint   Patient presents with   . Anxiety   . Panic Attack       SUBJECTIVE  Nichole [name] is a 31 y.o. female who presents to clinic for worsening anxiety.     Anxiety has recently worsened. Thinks financials, school, and traveling/ family time over the holidays have been triggers. She reports having intermittent panic attacks where she feels intense chest pain and tightness. Symptoms first started last Monday. Feels they never quite resolved. She has been taking propranolol TID and Paxil daily. Does not feel the propranolol has been helping. Of note, synthroid was recently increased secondary to elevated TSH.         Review of Systems:  Positive ROS discussed in HPI, otherwise all other systems negative.      Medications:   levothyroxine (SYNTHROID) 175 mcg Oral Tablet, Take 1 Tablet (175 mcg total) by mouth Every morning  PARoxetine (PAXIL) 40 mg Oral Tablet, Take 1 Tablet (40 mg total) by mouth Every morning  propranoloL (INDERAL) 10 mg Oral Tablet, Take 1 Tablet (10 mg total) by mouth Three times a day    No facility-administered medications prior to visit.      Allergies:   Allergies   Allergen Reactions   . Benzoyl Peroxide Itching, Photosensitivity, Rash and Swelling   . Nickel Rash         OBJECTIVE  BP 122/76 (Site: Left, Patient Position: Sitting, Cuff Size: Adult Small)   Pulse 80   Temp 36.9 C (98.5 F) (Thermal Scan)   Resp 14   Ht 1.499 m (4\' 11" )   Wt 52.3 kg (115 lb 4.8 oz)   SpO2 100%   BMI 23.29 kg/m       General: no distress  HENT: TMs clear, mouth mucous membranes moist, pharynx without injection or exudate   Neck: no thyromegaly   Lungs: clear to auscultation bilaterally  Cardiovascular: RRR, no murmur  Extremities: no cyanosis or edema  Skin: warm and dry, no rash  Neurologic: gait is normal, AOx3, CN 2-12 grossly  intact  Psychiatric: normal affect and behavior      ASSESSMENT/PLAN  (F41.1) GAD (generalized anxiety disorder)  (primary encounter diagnosis)  (F41.0) Panic disorder  Plan:   Chronic, acutely worsening   Will repeat TSH to r/o as possible underlying cause   Increase propranolol from 10 mg to 20 mg   propranoloL (INDERAL) 10 mg Oral Tablet  Will add hydrOXYzine HCL (ATARAX) 25 mg Oral Tablet  Await lab results and adjust plan accordingly     (E03.9) Hypothyroidism, unspecified type  Plan:   Chronic  Last TSH 91.79 in October 2022  Will repeat today and adjust dose accordingly         Orders Placed This Encounter   . propranoloL (INDERAL) 10 mg Oral Tablet   . hydrOXYzine HCL (ATARAX) 25 mg Oral Tablet         Return if symptoms worsen or fail to improve.        November 2022, PA-C 03/17/2021, 08:23

## 2021-04-28 ENCOUNTER — Ambulatory Visit (HOSPITAL_BASED_OUTPATIENT_CLINIC_OR_DEPARTMENT_OTHER): Payer: Managed Care, Other (non HMO) | Admitting: Physician Assistant

## 2021-06-29 ENCOUNTER — Ambulatory Visit: Payer: Managed Care, Other (non HMO) | Attending: Physician Assistant | Admitting: Physician Assistant

## 2021-06-29 ENCOUNTER — Other Ambulatory Visit (HOSPITAL_BASED_OUTPATIENT_CLINIC_OR_DEPARTMENT_OTHER): Payer: Managed Care, Other (non HMO)

## 2021-06-29 ENCOUNTER — Encounter (HOSPITAL_BASED_OUTPATIENT_CLINIC_OR_DEPARTMENT_OTHER): Payer: Self-pay | Admitting: Physician Assistant

## 2021-06-29 ENCOUNTER — Other Ambulatory Visit: Payer: Self-pay

## 2021-06-29 VITALS — BP 118/74 | HR 93 | Temp 97.5°F | Wt 119.7 lb

## 2021-06-29 DIAGNOSIS — F411 Generalized anxiety disorder: Secondary | ICD-10-CM | POA: Insufficient documentation

## 2021-06-29 DIAGNOSIS — E039 Hypothyroidism, unspecified: Secondary | ICD-10-CM

## 2021-06-29 LAB — THYROID STIMULATING HORMONE (SENSITIVE TSH): TSH: 3.365 u[IU]/mL (ref 0.430–3.550)

## 2021-06-29 LAB — THYROID STIMULATING HORMONE WITH FREE T4 REFLEX: TSH: 3.365 u[IU]/mL (ref 0.430–3.550)

## 2021-06-29 NOTE — Progress Notes (Signed)
Department of Family Medicine   Progress Note    Spenser Cong  MRN: [id number]  DOB: 07/10/1990  Date of Service: 06/29/2021    CHIEF COMPLAINT  Chief Complaint   Patient presents with   . Follow Up 3 Months       SUBJECTIVE  Nichole [name] is a 31 y.o. female who presents to clinic for follow up on anxiety and hypothyroidism.     Her anxiety has been fairly well controlled with Paxil and propranolol. She has not had any panic attacks. She has been going to therapy which has been helping with situational anxiety. She has a break coming up from school which will hopefully provide reprieve.     She has been consistent with her levothyroxine dose at 150 mcg. She does mention feeling some heart palpitations and increased anxiety like she did when her levels were off previously.       Review of Systems:  Positive ROS discussed in HPI, otherwise all other systems negative.      Medications:   hydrOXYzine HCL (ATARAX) 25 mg Oral Tablet, Take 1 Tablet (25 mg total) by mouth Every 8 hours as needed for Anxiety  levothyroxine (SYNTHROID) 150 mcg Oral Tablet, Take 1 Tablet (150 mcg total) by mouth Every morning  PARoxetine (PAXIL) 40 mg Oral Tablet, Take 1 Tablet (40 mg total) by mouth Every morning  propranoloL (INDERAL) 10 mg Oral Tablet, Take 2 Tablets (20 mg total) by mouth Three times a day    No facility-administered medications prior to visit.      Allergies:   Allergies   Allergen Reactions   . Benzoyl Peroxide Itching, Photosensitivity, Rash and Swelling   . Nickel Rash         OBJECTIVE  BP 118/74 (Site: Left, Patient Position: Sitting, Cuff Size: Adult)   Pulse 93   Temp 36.4 C (97.5 F) (Thermal Scan)   Wt 54.3 kg (119 lb 11.4 oz)   SpO2 99%   BMI 24.18 kg/m       General: no distress  HENT: TMs clear, mouth mucous membranes moist, pharynx without injection or exudate   Lungs: clear to auscultation bilaterally  Cardiovascular: RRR, no murmur  Extremities: no cyanosis or edema  Skin: warm and dry, no  rash  Neurologic: gait is normal, AOx3  Psychiatric: normal affect and behavior        ASSESSMENT/PLAN  (E03.9) Hypothyroidism, unspecified type  (primary encounter diagnosis)  Plan:   Chronic, will reassess labs today   Adjust synthroid dose accordingly   THYROID STIMULATING HORMONE WITH FREE T4 REFLEX          (F41.1) GAD (generalized anxiety disorder)  Plan:   Chronic, stable   Continue current regimen   Continue with CBT         Return in about 4 months (around 10/29/2021), or if symptoms worsen or fail to improve.      Ovidio Hanger, PA-C 06/29/2021, 11:05

## 2021-09-16 ENCOUNTER — Other Ambulatory Visit (HOSPITAL_BASED_OUTPATIENT_CLINIC_OR_DEPARTMENT_OTHER): Payer: Self-pay | Admitting: Physician Assistant

## 2021-09-16 ENCOUNTER — Other Ambulatory Visit: Payer: Self-pay | Admitting: Physician Assistant

## 2021-09-16 DIAGNOSIS — E039 Hypothyroidism, unspecified: Secondary | ICD-10-CM

## 2021-09-16 DIAGNOSIS — F410 Panic disorder [episodic paroxysmal anxiety] without agoraphobia: Secondary | ICD-10-CM

## 2021-09-16 DIAGNOSIS — F411 Generalized anxiety disorder: Secondary | ICD-10-CM

## 2021-09-16 DIAGNOSIS — F331 Major depressive disorder, recurrent, moderate: Secondary | ICD-10-CM

## 2021-09-28 ENCOUNTER — Emergency Department
Admission: EM | Admit: 2021-09-28 | Discharge: 2021-09-28 | Disposition: A | Discharge status: Home / Self Care | Payer: Managed Care, Other (non HMO)

## 2021-09-28 ENCOUNTER — Other Ambulatory Visit (HOSPITAL_BASED_OUTPATIENT_CLINIC_OR_DEPARTMENT_OTHER): Payer: Managed Care, Other (non HMO)

## 2021-09-28 DIAGNOSIS — W01198A Fall on same level from slipping, tripping and stumbling with subsequent striking against other object, initial encounter: Secondary | ICD-10-CM | POA: Insufficient documentation

## 2021-09-28 DIAGNOSIS — S0993XA Unspecified injury of face, initial encounter: Secondary | ICD-10-CM | POA: Insufficient documentation

## 2021-09-28 DIAGNOSIS — W010XXA Fall on same level from slipping, tripping and stumbling without subsequent striking against object, initial encounter: Secondary | ICD-10-CM

## 2021-09-28 DIAGNOSIS — Z23 Encounter for immunization: Secondary | ICD-10-CM | POA: Insufficient documentation

## 2021-09-28 DIAGNOSIS — Y9301 Activity, walking, marching and hiking: Secondary | ICD-10-CM

## 2021-09-28 MED ORDER — IBUPROFEN 200 MG TABLET
800.0000 mg | ORAL_TABLET | ORAL | Status: AC
Start: 2021-09-28 — End: 2021-09-28
  Administered 2021-09-28: 800 mg via ORAL
  Filled 2021-09-28: qty 4

## 2021-09-28 MED ORDER — DIPHTH,PERTUSSIS(ACEL),TETANUS 2.5 LF UNIT-8 MCG-5 LF/0.5ML IM SYRINGE
0.5000 mL | INJECTION | INTRAMUSCULAR | Status: AC
Start: 2021-09-28 — End: 2021-09-28
  Administered 2021-09-28: 0.5 mL via INTRAMUSCULAR
  Filled 2021-09-28: qty 0.5

## 2021-09-28 NOTE — ED Nurses Note (Signed)
Pt left unit ambulatory with discharge instructions. Pt verbalized understanding of instructions given. No s/s distress noted at time of discharge.

## 2021-09-28 NOTE — ED Triage Notes (Signed)
[facility] - Emergency Department   Provider in Triage Note     Patient Name: Nichole [name]  Patient MRN: [id number]  Date and Time of Assessment: 09/28/2021 18:52     Chief Complaint   Patient presents with   . Facial Injury     Pt reports she was walking, tripped, and fell forward striking face on brick wall, -LOC, denies nausea, no vision changes or dizziness       Brief HPI:   31 yr female, tripped and fell, landing with her face onto a brick wall. No LOC, denies vision changes, confusion, etc. Pt most worried about possible broken nose. States she can breathe out of it ok    Physical Exam:   AxO in NAD  Several abrasions on her face, above R eye, on R maxillary and bridge of nose    Preliminary Plan:  Imaging ordered  Patient will return to waiting room      Delsa Sale, PA-C

## 2021-09-28 NOTE — ED Provider Notes (Signed)
[facility] - Emergency Department  ED Primary Provider Note  History of Present Illness   Chief Complaint   Patient presents with   . Facial Injury     Pt reports she was walking, tripped, and fell forward striking face on brick wall, -LOC, denies nausea, no vision changes or dizziness     Nichole [name] is a 31 y.o. female who had concerns including Facial Injury.  Arrival: The patient arrived by Private Vehicle    HPI   Patient reports she tripped while walking and fell forward, hitting her face on a brick wall. Patient denies anticoagulant use. She denies LOC as well as dizziness, vision changes, nausea, or other complaints or concerns at this time.     Review of Systems   Pertinent positive and negative ROS as per HPI.  Historical Data   History Reviewed This Encounter:     Physical Exam   ED Triage Vitals [09/28/21 1846]   BP (Non-Invasive) (!) 143/88   Heart Rate 88   Respiratory Rate 18   Temperature 37 C (98.6 F)   SpO2 99 %   Weight 56 kg (123 lb 7.3 oz)   Height 1.499 m (4\' 11" )     Physical Exam  Constitutional:       Appearance: Normal appearance.   HENT:      Head: Normocephalic. Abrasion (above right eyebrow, to right cheek, and right nose. Bleeding controlled) present.      Nose: Nose normal.      Mouth/Throat:      Pharynx: Oropharynx is clear.   Eyes:      Extraocular Movements: Extraocular movements intact.   Pulmonary:      Effort: No respiratory distress.   Abdominal:      General: Abdomen is flat.   Musculoskeletal:         General: No deformity.   Skin:     Findings: Abrasion (see above) present.   Neurological:      General: No focal deficit present.      Mental Status: She is alert and oriented to person, place, and time.       Patient Data   Labs Ordered/Reviewed - No data to display  XR ORBITS BILATERAL   Final Result by , MD (07/17 2102)   No acute fracture or traumatic malalignment.             XR NASAL BONES   Final Result by 2103, MD  (07/17 2102)   No evidence of acute fracture or traumatic malalignment.              Medical Decision Making       Medical Decision Making  Patient reportedly fell into brick wall. Xrays ordered and were negative for acute injury. Patient abrasions were cleaned with sterile water and bacitracin was applied. Patient was medicated with motrin for pain and tetanus booster was updated.  Patient had no other injuries. Patient was comfortable with discharge and has no further needs at this time.    Facial injury: acute illness or injury  Amount and/or Complexity of Data Reviewed  Radiology: ordered. Decision-making details documented in ED Course.      Risk  OTC drugs.  Prescription drug management.               Medications Administered in the ED   diphtheria, pertussis-acell, tetanus (BOOSTRIX) IM injection (0.5 mL IntraMUSCULAR Given 09/28/21 2033)   ibuprofen (MOTRIN) tablet (  800 mg Oral Given 09/28/21 2032)     Clinical Impression   Facial injury (Primary)       Disposition: Discharged           I am scribing for, and in the presence of, Janyth Pupa, APRN NP-C, for services provided on 09/29/2021.   Fredric Mare, SCRIBE    I personally performed the services described in this documentation, as scribed in my presence, and it is both accurate and complete.     The co-signing faculty was physically present and available for consultation and did not physically see this patient.  Janyth Pupa, APRN,NP-C  09/29/2021, 11:09      *

## 2021-09-28 NOTE — ED Nurses Note (Signed)
Patient presents to the ED for facial injury. Patient states while walking up the stairs to her apartment today she tripped, hitting the right side of her face. Tenderness around right eye. Abrasion and bruising around right eye. Patient denies LOC.

## 2021-09-29 ENCOUNTER — Telehealth: Payer: Self-pay | Admitting: General Practice

## 2021-09-29 NOTE — Telephone Encounter (Signed)
Transition Care Management Unsuccessful Follow-up Telephone Call  Date of discharge and from where:  09/28/21 from [facility]  Attempts:  1st Attempt  Reason for unsuccessful TCM follow-up call:  Left voice message

## 2021-09-30 NOTE — Telephone Encounter (Signed)
Transition Care Management Unsuccessful Follow-up Telephone Call  Date of discharge and from where:  09/28/21 from [facility]  Attempts:  2nd Attempt  Reason for unsuccessful TCM follow-up call:  No answer/busy

## 2021-10-06 NOTE — Telephone Encounter (Signed)
Transition Care Management Unsuccessful Follow-up Telephone Call  Date of discharge and from where:  09/28/21 from [facility]  Attempts:  3rd Attempt  Reason for unsuccessful TCM follow-up call:  No answer/busy

## 2021-10-29 ENCOUNTER — Ambulatory Visit (HOSPITAL_BASED_OUTPATIENT_CLINIC_OR_DEPARTMENT_OTHER): Payer: Managed Care, Other (non HMO) | Admitting: Physician Assistant

## 2021-11-04 ENCOUNTER — Other Ambulatory Visit: Payer: Self-pay

## 2021-11-04 ENCOUNTER — Encounter (HOSPITAL_BASED_OUTPATIENT_CLINIC_OR_DEPARTMENT_OTHER): Payer: Self-pay | Admitting: Physician Assistant

## 2021-11-04 ENCOUNTER — Other Ambulatory Visit (HOSPITAL_BASED_OUTPATIENT_CLINIC_OR_DEPARTMENT_OTHER): Payer: Managed Care, Other (non HMO)

## 2021-11-04 ENCOUNTER — Ambulatory Visit: Payer: Managed Care, Other (non HMO) | Attending: Physician Assistant | Admitting: Physician Assistant

## 2021-11-04 VITALS — BP 112/74 | HR 100 | Temp 98.3°F | Ht 59.37 in | Wt 125.2 lb

## 2021-11-04 DIAGNOSIS — R635 Abnormal weight gain: Secondary | ICD-10-CM | POA: Insufficient documentation

## 2021-11-04 DIAGNOSIS — E039 Hypothyroidism, unspecified: Secondary | ICD-10-CM | POA: Insufficient documentation

## 2021-11-04 DIAGNOSIS — F411 Generalized anxiety disorder: Secondary | ICD-10-CM | POA: Insufficient documentation

## 2021-11-04 DIAGNOSIS — S0992XS Unspecified injury of nose, sequela: Secondary | ICD-10-CM | POA: Insufficient documentation

## 2021-11-04 LAB — THYROID STIMULATING HORMONE (SENSITIVE TSH): TSH: 9.319 u[IU]/mL — ABNORMAL HIGH (ref 0.430–3.550)

## 2021-11-04 NOTE — Progress Notes (Signed)
Department of Family Medicine   Progress Note    Nichole [name]  MRN: [id number]  DOB: [birth year]  Date of Service: 11/04/2021    CHIEF COMPLAINT  Chief Complaint   Patient presents with    Follow Up    Hypothyroidism       SUBJECTIVE  Nichole [name] is a 31 y.o. female who presents to clinic for follow up on her anxiety and hypothyroidism.     Patient has almost completely cut coffee out of her diet. States this has helped with her anxiety tremendously. She's not been using propranolol or hydroxyzine. Symptoms have been well controlled with Paxil alone.     She is concerned about gradual weight gain. Has been working out and eating well. Has also been compliant with synthroid. She is doing mostly strength training. Denies abnormal periods, headaches, palpitations, or changes in BM's.     Lastly, she suffered a fall in July where she fell into a retaining wall making the majority of contact along the bridge of her nose.  She was seen in the emergency department and told that she did not have a fracture, but she is concerned about a bump that is formed on the left side of her nose that was not there previously and also feels that her nose is shifted to the left. Denies nasal congestion, snoring, difficulty breathing, or voice changes.      Review of Systems:  Positive ROS discussed in HPI, otherwise all other systems negative.      Medications:   hydrOXYzine HCL (ATARAX) 25 mg Oral Tablet, Take 1 Tablet (25 mg total) by mouth Every 8 hours as needed for Anxiety  levothyroxine (SYNTHROID) 150 mcg Oral Tablet, Take 1 Tablet (150 mcg total) by mouth Every morning  PARoxetine (PAXIL) 40 mg Oral Tablet, Take 1 Tablet (40 mg total) by mouth Every morning  propranoloL (INDERAL) 10 mg Oral Tablet, Take 2 Tablets (20 mg total) by mouth Three times a day    No facility-administered medications prior to visit.      Allergies:   Allergies   Allergen Reactions    Benzoyl Peroxide Itching, Photosensitivity, Rash and Swelling     Nickel Rash         OBJECTIVE  BP 112/74   Pulse 100   Temp 36.8 C (98.3 F) (Thermal Scan)   Ht 1.508 m (4' 11.37")   Wt 56.8 kg (125 lb 3.5 oz)   LMP 10/29/2021   SpO2 100%   BMI 24.98 kg/m       General: no distress  HENT: normocephalic, atraumatic, EOMs intact, small deformity appreciated on nasal bridge along left lateral aspect, leftward shift appreciated, nares patent, trachea midline, neck supple  Lungs: clear to auscultation bilaterally  Cardiovascular: RRR, no murmur  Extremities: no cyanosis or edema  Skin: warm and dry, no rash  Neurologic: gait is normal, AOx3, CN 2-12 grossly intact  Psychiatric: normal affect and behavior    ASSESSMENT/PLAN  (F41.1) GAD (generalized anxiety disorder)  (primary encounter diagnosis)  Plan:   Chronic, controlled   Continue current regimen   May use propranolol and hydroxyzine PRN     (R63.5) Weight gain  Plan:   Acute concern   Possibly secondary to increased muscle mass vs uncontrolled hypothyroidism vs medication side effects vs excess calorie intake   Discussed dietary modifications   Will repeat TSH to r/o contributing cause     (E03.9) Hypothyroidism, unspecified type  Plan:   Chronic, stable  Repeat TSH for monitoring   TSH          (S09.92XS) Nose injury, sequela  Plan:   Acute concern   Will refer for consultation given symptoms and notable deformity   AMB CONSULT/REFERRAL PLASTIC         SURGERY-[facility]               Orders Placed This Encounter    TSH    AMB CONSULT/REFERRAL PLASTIC SURGERY-P[facility]-[place]         Return in about 3 months (around 02/04/2022), or if symptoms worsen or fail to improve.      Ovidio Hanger, PA-C 11/04/2021, 13:41

## 2021-11-14 ENCOUNTER — Other Ambulatory Visit (HOSPITAL_BASED_OUTPATIENT_CLINIC_OR_DEPARTMENT_OTHER): Payer: Self-pay | Admitting: Physician Assistant

## 2021-11-14 DIAGNOSIS — E039 Hypothyroidism, unspecified: Secondary | ICD-10-CM

## 2021-11-14 MED ORDER — LEVOTHYROXINE 175 MCG TABLET
175.0000 ug | ORAL_TABLET | Freq: Every morning | ORAL | 0 refills | Status: DC
Start: 2021-11-14 — End: 2022-02-15

## 2022-01-07 ENCOUNTER — Encounter (HOSPITAL_BASED_OUTPATIENT_CLINIC_OR_DEPARTMENT_OTHER): Payer: Self-pay

## 2022-02-08 ENCOUNTER — Ambulatory Visit (HOSPITAL_BASED_OUTPATIENT_CLINIC_OR_DEPARTMENT_OTHER): Payer: Self-pay | Admitting: Physician Assistant

## 2022-02-10 ENCOUNTER — Ambulatory Visit (HOSPITAL_BASED_OUTPATIENT_CLINIC_OR_DEPARTMENT_OTHER): Payer: Self-pay | Admitting: Physician Assistant

## 2022-02-13 ENCOUNTER — Other Ambulatory Visit (HOSPITAL_BASED_OUTPATIENT_CLINIC_OR_DEPARTMENT_OTHER): Payer: Self-pay | Admitting: Physician Assistant

## 2022-02-13 DIAGNOSIS — E039 Hypothyroidism, unspecified: Secondary | ICD-10-CM

## 2022-02-19 ENCOUNTER — Ambulatory Visit
Payer: Managed Care, Other (non HMO) | Attending: Student in an Organized Health Care Education/Training Program | Admitting: Physician Assistant

## 2022-02-19 ENCOUNTER — Other Ambulatory Visit: Payer: Self-pay

## 2022-02-19 ENCOUNTER — Encounter (HOSPITAL_BASED_OUTPATIENT_CLINIC_OR_DEPARTMENT_OTHER): Payer: Self-pay | Admitting: Physician Assistant

## 2022-02-19 ENCOUNTER — Other Ambulatory Visit (HOSPITAL_BASED_OUTPATIENT_CLINIC_OR_DEPARTMENT_OTHER): Payer: Managed Care, Other (non HMO)

## 2022-02-19 VITALS — BP 100/66 | HR 83 | Temp 97.5°F | Ht 59.37 in | Wt 119.5 lb

## 2022-02-19 DIAGNOSIS — E039 Hypothyroidism, unspecified: Secondary | ICD-10-CM | POA: Insufficient documentation

## 2022-02-19 DIAGNOSIS — E0789 Other specified disorders of thyroid: Secondary | ICD-10-CM

## 2022-02-19 DIAGNOSIS — F411 Generalized anxiety disorder: Secondary | ICD-10-CM | POA: Insufficient documentation

## 2022-02-19 DIAGNOSIS — R3915 Urgency of urination: Secondary | ICD-10-CM | POA: Insufficient documentation

## 2022-02-19 DIAGNOSIS — R82998 Other abnormal findings in urine: Secondary | ICD-10-CM | POA: Insufficient documentation

## 2022-02-19 LAB — URINALYSIS, MACROSCOPIC
BILIRUBIN: NEGATIVE mg/dL
BLOOD: NEGATIVE mg/dL
COLOR: NORMAL
GLUCOSE: NEGATIVE mg/dL
KETONES: NEGATIVE mg/dL
NITRITE: POSITIVE — AB
PH: 6 (ref 5.0–8.0)
PROTEIN: NEGATIVE mg/dL
SPECIFIC GRAVITY: 1.019 (ref 1.005–1.030)
UROBILINOGEN: NEGATIVE mg/dL

## 2022-02-19 LAB — BASIC METABOLIC PANEL
ANION GAP: 7 mmol/L (ref 4–13)
BUN/CREA RATIO: 11 (ref 6–22)
BUN: 10 mg/dL (ref 8–25)
CALCIUM: 9 mg/dL (ref 8.6–10.2)
CHLORIDE: 106 mmol/L (ref 96–111)
CO2 TOTAL: 26 mmol/L (ref 22–30)
CREATININE: 0.91 mg/dL (ref 0.60–1.05)
ESTIMATED GFR - FEMALE: 85 mL/min/BSA (ref >=60)
GLUCOSE: 100 mg/dL (ref 65–125)
POTASSIUM: 4.2 mmol/L (ref 3.5–5.1)
SODIUM: 139 mmol/L (ref 136–145)

## 2022-02-19 LAB — URINALYSIS, MICROSCOPIC
RBCS: 0 /hpf (ref <6.0)
WBCS: 81 /hpf — ABNORMAL HIGH (ref <11.0)

## 2022-02-19 LAB — THYROID STIMULATING HORMONE (SENSITIVE TSH): TSH: 4.509 u[IU]/mL (ref 0.350–4.940)

## 2022-02-19 MED ORDER — NITROFURANTOIN MONOHYDRATE/MACROCRYSTALS 100 MG CAPSULE
100.0000 mg | ORAL_CAPSULE | Freq: Two times a day (BID) | ORAL | 0 refills | Status: AC
Start: 2022-02-19 — End: 2022-02-24

## 2022-02-19 NOTE — Progress Notes (Signed)
Department of Family Medicine   Progress Note    Nichole [name]  MRN: [id number]  DOB: January 08, 1991  Date of Service: 02/19/2022    CHIEF COMPLAINT  Chief Complaint   Patient presents with    Medication Check F/U    Urinary Pain     Burning     Urinary Frequency       SUBJECTIVE  Nichole [name] is a 31 y.o. female who presents to clinic for med check and concerns of UTI.     Patient reports symptoms of urinary urgency and increased frequency, as well as dysuria that started approx 3 days ago. She denies any pelvic pain, fevers, chills, nausea, or vomiting. No vaginal discharge, itching, or irritation. She does notice that her urine has been foamy, even prior to symptom onset. Denies polyuria or polydipsia.     Her anxiety has been manageable. Recently worsened due to life stressors. She feels medication is still working well for her. Has not been taking propranolol or hydroxyzine.     Lastly, has been stable on dose of levothyroxine. She denies breakthrough symptoms like abnormal menses, palpitations, worsening anxiety, headaches, or temperature intolerances.       Review of Systems:  Positive ROS discussed in HPI, otherwise all other systems negative.      Medications:   hydrOXYzine HCL (ATARAX) 25 mg Oral Tablet, Take 1 Tablet (25 mg total) by mouth Every 8 hours as needed for Anxiety (Patient not taking: Reported on 02/19/2022)  levothyroxine (SYNTHROID) 175 mcg Oral Tablet, Take 1 Tablet (175 mcg total) by mouth Every morning  PARoxetine (PAXIL) 40 mg Oral Tablet, Take 1 Tablet (40 mg total) by mouth Every morning  propranoloL (INDERAL) 10 mg Oral Tablet, Take 2 Tablets (20 mg total) by mouth Three times a day (Patient not taking: Reported on 02/19/2022)    No facility-administered medications prior to visit.      Allergies:   Allergies   Allergen Reactions    Amoxicillin Rash    Benzoyl Peroxide Itching, Photosensitivity, Rash and Swelling    Nickel Rash         OBJECTIVE  BP 100/66 (Site: Left, Patient  Position: Sitting, Cuff Size: Adult)   Pulse 83   Temp 36.4 C (97.5 F) (Thermal Scan)   Ht 1.508 m (4' 11.37")   Wt 54.2 kg (119 lb 7.8 oz)   SpO2 100%   BMI 23.83 kg/m       General: no distress  HENT: TMs clear, mouth mucous membranes moist, pharynx without injection or exudate   Neck: thyroid fullness appreciated   Lungs: clear to auscultation bilaterally  Cardiovascular: RRR, no murmur  Abdomen: soft, non tender, bowel sounds present, no CVA tenderness  Extremities: no cyanosis or edema  Skin: warm and dry, no rash  Neurologic: gait is normal, AOx3  Psychiatric: normal affect and behavior        ASSESSMENT/PLAN  [facility], [address], [place], [place] [address]   Urine test  (Siemens Multistix 10 SG)   Performed Status: Manual   Time collected 1606   Color (Ref Range: Yellow) Yellow   Clarity (Ref Range: Clear) (!) Cloudy   Glucose (Ref Range: Negative mg/dL) Negative   Bilirubin (Ref Range: Negative mg/dL) Negative   Ketones (Ref Range: Negative mg/dL) Negative   Urine Specific Gravity (Ref Range: 1.005 - 1.030) 1.025   Blood (urine) (Ref Range: Negative mg/dL) (!) Small (1+)   pH (Ref Range: 5.0 - 8.0)  6.0   Protein (Ref Range: Negative mg/dL) Negative   Urobilinogen (Ref Range: Normal) Normal    Nitrite (Ref Range: Negative) (!) Positive   Leukocytes (Ref Range: Negative WBC's/uL) (!) 2+   Bottle Number   (Siemens Multistix 10 SG) 29   Lot # [id number]   Expiration Date 07/13/22   Micro Sent no   Culture Sent no   Initials BG       (F41.1) GAD (generalized anxiety disorder)  (primary encounter diagnosis)  Plan:   Chronic, stable   Con't Paxil and CBT   Will leave hydroxyzine and propranolol on med list for PRN use    (E03.9) Hypothyroidism, unspecified type  Plan:   Chronic, stable   Repeat labs for monitoring   Adjust levothyroxine dose accordingly     (E07.89) Thyroid fullness  Plan:   Chronic, subjectively worsening   US THYROID          (R39.15) Urinary  urgency  Plan:   Acute concern   Symptoms and dip most concerning for UTI   Will treat for uncomplicated UTI   POCT Urine dipstick, URINALYSIS, MACROSCOPIC         AND MICROSCOPIC, URINE CULTURE, nitrofurantoin         monohyd/m-cryst (MACROBID) 100 mg Oral Capsule  Advised on increasing clear fluid intake, regular voiding trials, and hygiene habits for prevention           (R82.998) Foamy urine  Plan:   Acute concern   Low concern for underlying process   Advised on water intake   BASIC METABOLIC PANEL            Orders Placed This Encounter    URINE CULTURE    US THYROID    URINALYSIS, MACROSCOPIC AND MICROSCOPIC    BASIC METABOLIC PANEL    URINALYSIS, MACROSCOPIC    URINALYSIS, MICROSCOPIC    POCT Urine dipstick    nitrofurantoin monohyd/m-cryst (MACROBID) 100 mg Oral Capsule         Return in about 3 months (around 05/21/2022).        Ovidio Hanger, PA-C 02/19/2022, 16:14

## 2022-02-19 NOTE — Nursing Note (Signed)
02/19/22 1600   Required: Location Test Performed At:   [facility], [address], [place], [place] [address]   Urine test  (Siemens Multistix 10 SG)   Performed Status: Manual   Time collected 1606   Color (Ref Range: Yellow) Yellow   Clarity (Ref Range: Clear) (!) Cloudy   Glucose (Ref Range: Negative mg/dL) Negative   Bilirubin (Ref Range: Negative mg/dL) Negative   Ketones (Ref Range: Negative mg/dL) Negative   Urine Specific Gravity (Ref Range: 1.005 - 1.030) 1.025   Blood (urine) (Ref Range: Negative mg/dL) (!) Small (1+)   pH (Ref Range: 5.0 - 8.0) 6.0   Protein (Ref Range: Negative mg/dL) Negative   Urobilinogen (Ref Range: Normal) Normal    Nitrite (Ref Range: Negative) (!) Positive   Leukocytes (Ref Range: Negative WBC's/uL) (!) 2+   Bottle Number   (Siemens Multistix 10 SG) 29   Lot # [id number]   Expiration Date 07/13/22   Micro Sent no   Culture Sent no   Initials BG     Glo Herring, Ambulatory Care Assistant

## 2022-02-21 LAB — URINE CULTURE: URINE CULTURE: >100000 — AB

## 2022-02-22 ENCOUNTER — Inpatient Hospital Stay
Admission: RE | Admit: 2022-02-22 | Discharge: 2022-02-22 | Disposition: A | Discharge status: Home / Self Care | Payer: Managed Care, Other (non HMO) | Source: Ambulatory Visit | Attending: Physician Assistant | Admitting: Physician Assistant

## 2022-02-22 ENCOUNTER — Other Ambulatory Visit: Payer: Self-pay

## 2022-02-22 DIAGNOSIS — E0789 Other specified disorders of thyroid: Secondary | ICD-10-CM | POA: Insufficient documentation

## 2022-02-22 DIAGNOSIS — R590 Localized enlarged lymph nodes: Secondary | ICD-10-CM

## 2022-02-25 ENCOUNTER — Encounter (HOSPITAL_BASED_OUTPATIENT_CLINIC_OR_DEPARTMENT_OTHER): Payer: Self-pay | Admitting: Physician Assistant

## 2022-03-02 ENCOUNTER — Other Ambulatory Visit (HOSPITAL_BASED_OUTPATIENT_CLINIC_OR_DEPARTMENT_OTHER): Payer: Self-pay | Admitting: Physician Assistant

## 2022-03-02 DIAGNOSIS — E039 Hypothyroidism, unspecified: Secondary | ICD-10-CM

## 2022-03-19 ENCOUNTER — Other Ambulatory Visit: Payer: Self-pay

## 2022-03-19 ENCOUNTER — Ambulatory Visit: Payer: Managed Care, Other (non HMO) | Attending: "Endocrinology | Admitting: "Endocrinology

## 2022-03-19 ENCOUNTER — Encounter (HOSPITAL_BASED_OUTPATIENT_CLINIC_OR_DEPARTMENT_OTHER): Payer: Self-pay | Admitting: "Endocrinology

## 2022-03-19 ENCOUNTER — Encounter (HOSPITAL_BASED_OUTPATIENT_CLINIC_OR_DEPARTMENT_OTHER): Payer: Self-pay | Admitting: Physician Assistant

## 2022-03-19 DIAGNOSIS — E039 Hypothyroidism, unspecified: Secondary | ICD-10-CM | POA: Insufficient documentation

## 2022-03-19 NOTE — H&P (Unsigned)
Department of Endocrinology  New Patient Visit    Date:   03/19/2022  Name: Nichole [name]  Age: 32 y.o.  MRN: [id number]    Chief Complaint: Hypothyroidism    History of Present Illness  Nichole [name] is a 32 y.o. female with PMH of hypothyroidism who presents to Endocrinology Clinic as a referral from pcp for evaluation.    Hypothyroidism  Patient referred by her PCP for evaluation. She has a history of hypothyroidism which was diagnosed at the age of 1. There is positive family history of hypothyroidism on her father's side. No family history of thyroid cancer. Last TSH 4.509 while on Levothyroxine 175 mcg daily. She a recent thyroid ultrasound ordered by her PCP on 02/22/2022 which showed an atrophied hypovascular thyroid gland: R lobe 3.4x0.5x0.7cm, L lobe 3.9x0.7cx0.7cm, and a 0.3cm in the left inferior thyroid which was suggestive of a central compartment lymph node.       Review of Systems:   All systems reviewed and negative unless otherwise noted above in HPI.    Past Medical History:    Past Medical History:   Diagnosis Date    Anxiety     Hypertension     Hypothyroidism        Past Surgical History:    Past Surgical History:   Procedure Laterality Date    HX WISDOM TEETH EXTRACTION         Medications:  Current Outpatient Medications   Medication Sig    hydrOXYzine HCL (ATARAX) 25 mg Oral Tablet Take 1 Tablet (25 mg total) by mouth Every 8 hours as needed for Anxiety (Patient not taking: Reported on 02/19/2022)    levothyroxine (SYNTHROID) 175 mcg Oral Tablet Take 1 Tablet (175 mcg total) by mouth Every morning    PARoxetine (PAXIL) 40 mg Oral Tablet Take 1 Tablet (40 mg total) by mouth Every morning    propranoloL (INDERAL) 10 mg Oral Tablet Take 2 Tablets (20 mg total) by mouth Three times a day (Patient not taking: Reported on 02/19/2022)       Allergies:  Allergies   Allergen Reactions    Amoxicillin Rash    Benzoyl Peroxide Itching, Photosensitivity, Rash and Swelling    Nickel Rash       Family  History:    Family Medical History:       Problem Relation (Age of Onset)    Anxiety Mother    Diabetes type II Father    Healthy Brother    Hypertension (High Blood Pressure) Mother, Father, Maternal Grandfather, Paternal Grandmother, Paternal Grandfather    Osteoporosis Maternal Grandmother    Prostate Cancer Maternal Grandfather    Thyroid Disease Father, Paternal Grandmother            Social History:    Social History     Tobacco Use    Smoking status: Never    Smokeless tobacco: Never    Tobacco comments:     once a month   Vaping Use    Vaping Use: Never used   Substance Use Topics    Alcohol use: Yes     Comment: 1-2 glasses/ week    Drug use: Never         Examination:  BP 120/78   Pulse 62   Temp (!) 35.6 C (96 F)   Ht 1.499 m (4\' 11" )   Wt 55.2 kg (121 lb 11.1 oz)   SpO2 95%   BMI 24.58 kg/m       General:  Well appearing, appears stated age, and is in no distress.  Vital signs reviewed.  Eyes:  Conjunctiva clear.  EOMI.  Sclera non-icteric.  No proptosis or lid-lag noted.  HENT:  Normocephalic, atraumatic.  Mucous membranes moist.  Neck:  Trachea midline.  Thyroid: No thyromegaly  Heme/Lymph:  No cervical, preauricular, posterior auricular, tonsillar, submandibular, or submental lymphadenopathy.  Respiratory:  No audible wheezing or increased work of breathing.  Cardiovascular:  Regular rate and rhythm.  Musculoskeletal:  No cyanosis or edema.  No obvious deformities.  Skin:  Warm and dry.  No rashes or lesions noted.    Neurologic:  Alert and oriented.  Cranial nerves II-XII grossly intact.  Gait: No deficits  Psychiatric:  Behavior and affect normal.        Data reviewed:      No results found for: "HA1C"  Lab Results   Component Value Date    TSH 4.509 02/19/2022    FREET4 1.36 (H) 03/17/2021       Assessment and Plan:  Nichole [name] is a 32 y.o. female with PMH of hypothyroidism who presents to Endocrinology Clinic as a referral from pcp for evaluation.    1) Hypothyroidism  History of  hypothyroidism which was diagnosed at the age of 1.   Positive family history of hypothyroidism on her father's side.   Last TSH 4.509 while on Levothyroxine 175 mcg daily.   Recent thyroid ultrasound ordered by her PCP on 02/22/2022 which showed an atrophied hypovascular thyroid gland: R lobe 3.4x0.5x0.7cm, L lobe 3.9x0.7cx0.7cm, and a 0.3cm in the left inferior thyroid which was suggestive of a central compartment lymph node.     Plan  Explained to patient that the 0.3cm subcentimeter focus on her thyroid gland is not concerning because of the size and because it is likely a lymph node. I recommend for her PCP to order a thyroid ultrasound in 1 year for surveillance of the nodule.  I also recommended patient to continue her current Levothyroxine dose as her thyroid hormone levels are within the desired range.    Patient looked disappointed after I made my recommendations and I inquired if she had any other questions.   Patient stated that I was behaving like I was trying to rush out of her appointment. She also stated that she was sent to our office for evaluation of her thyroid nodule not her hypothyroidism and I did not know why she was sent to our clinic for evaluation.   I reviewed her PCP's last note and the diagnosis that was used for her to be evaluated by our office was HYPOTHYROIDISM and not thyroid fullness. I let patient know this fact.   I told the patient that I asked her multiple times during her appointment she had any other questions for me in regards to her hypothyroidism or her thyroid ultrasound findings in which she responded she did not have any more questions.   I am not sure why patient stated that I was trying to rush her out of her appointment as I inquired multiple times if she had any questions.  I informed her that I will document all her comments she made during her visit today.    RTC in ***    No orders of the defined types were placed in this encounter.    This note may have been  partially generated using MModal Fluency Direct system, and there may be some incorrect words, spellings, and punctuation that were not noted in checking  the note before saving.    Silvestre Gunner, MD 03/19/2022, 09:04  Assistant Professor  Endocrinology & Metabolism  [facility]

## 2022-03-22 ENCOUNTER — Encounter (HOSPITAL_BASED_OUTPATIENT_CLINIC_OR_DEPARTMENT_OTHER): Payer: Self-pay | Admitting: "Endocrinology

## 2022-04-12 ENCOUNTER — Encounter (HOSPITAL_COMMUNITY): Payer: Self-pay

## 2022-04-19 ENCOUNTER — Other Ambulatory Visit: Payer: Self-pay | Admitting: Physician Assistant

## 2022-04-19 DIAGNOSIS — F411 Generalized anxiety disorder: Secondary | ICD-10-CM

## 2022-04-19 DIAGNOSIS — F410 Panic disorder [episodic paroxysmal anxiety] without agoraphobia: Secondary | ICD-10-CM

## 2022-04-19 DIAGNOSIS — F331 Major depressive disorder, recurrent, moderate: Secondary | ICD-10-CM

## 2022-05-23 ENCOUNTER — Other Ambulatory Visit: Payer: Self-pay | Admitting: Physician Assistant

## 2022-05-23 DIAGNOSIS — F410 Panic disorder [episodic paroxysmal anxiety] without agoraphobia: Secondary | ICD-10-CM

## 2022-05-23 DIAGNOSIS — F331 Major depressive disorder, recurrent, moderate: Secondary | ICD-10-CM

## 2022-05-23 DIAGNOSIS — F411 Generalized anxiety disorder: Secondary | ICD-10-CM

## 2022-05-27 ENCOUNTER — Encounter (HOSPITAL_BASED_OUTPATIENT_CLINIC_OR_DEPARTMENT_OTHER): Payer: Self-pay | Admitting: Physician Assistant

## 2022-06-09 ENCOUNTER — Ambulatory Visit (HOSPITAL_BASED_OUTPATIENT_CLINIC_OR_DEPARTMENT_OTHER): Payer: Managed Care, Other (non HMO) | Admitting: Physician Assistant

## 2022-06-11 ENCOUNTER — Ambulatory Visit: Payer: PRIVATE HEALTH INSURANCE | Attending: Physician Assistant | Admitting: Physician Assistant

## 2022-06-11 ENCOUNTER — Ambulatory Visit (INDEPENDENT_AMBULATORY_CARE_PROVIDER_SITE_OTHER): Payer: Self-pay | Admitting: PLASTIC SURGERY

## 2022-06-11 ENCOUNTER — Other Ambulatory Visit: Payer: Self-pay

## 2022-06-11 ENCOUNTER — Encounter (HOSPITAL_BASED_OUTPATIENT_CLINIC_OR_DEPARTMENT_OTHER): Payer: Self-pay | Admitting: Physician Assistant

## 2022-06-11 DIAGNOSIS — E041 Nontoxic single thyroid nodule: Secondary | ICD-10-CM

## 2022-06-11 DIAGNOSIS — E039 Hypothyroidism, unspecified: Secondary | ICD-10-CM

## 2022-06-11 NOTE — Progress Notes (Signed)
FAMILY MEDICINE, [place]  [place] [address]  Operated by [place]  Telephone Visit    Name:  Nichole [name] MRN: [id number]   Date:  06/11/2022 Age:   32 y.o.     The patient/family initiated a request for telephone service.  Verbal consent for this service was obtained from the patient/family.    Last office visit in this department: 02/19/2022      Reason for call:   Chief Complaint   Patient presents with    Referrals       Call notes:  Patient was referred to endocrinology for recent thyroid ultrasound results.  [place] was ordered due to worsening thyroid fullness and some pressure on her throat when swallowing.  There was a small nodule, that we did discuss at her previous appointment not being concerning due to how small it was.  There were also tissue changes showing atrophy and hypervascularity.  She has had a diagnosis of hypothyroidism since childhood (patient says about 32 years old).  Has gradually needed to increase her Synthroid dose.  She was looking for the referral more to discuss her condition and expectations as she goes through life.  She reports feeling like the previous physician she saw was not a good fit for her.  Of note, she has never been evaluated for Hashimoto's as far as she is aware.           ICD-10-CM    1. Hypothyroidism (acquired)  E03.9 THYROPEROXIDASE (TPO) ANTIBODIES, SERUM     THYROID STIMULATING HORMONE WITH FREE T4 REFLEX      2. Thyroid nodule  E04.1       Hypothyroidism is chronic and controlled on current dose of Synthroid 175 mcg.  We discussed that the nodule is not uncommon for her diagnosis and will be monitored to assess change in size and character.  Plan to repeat ultrasound within 1 year.  She is also aware that tissue changes are an expected finding with her diagnosis.  She is interested in a second opinion to discuss her condition more in detail and anticipatory guidance as she ages; not for Synthroid  management or further work up of the nodule unless deemed indicated.  Will assess for TPO antibodies to rule out Hashimoto's as underlying cause of hypothyroidism on next lab draw.    Total provider time spent with the patient on the phone: 20 minutes.    Eupha Lobb Dalton, PA-C

## 2022-06-24 ENCOUNTER — Other Ambulatory Visit: Payer: Self-pay

## 2022-06-24 ENCOUNTER — Other Ambulatory Visit: Payer: PRIVATE HEALTH INSURANCE | Attending: Physician Assistant

## 2022-06-24 DIAGNOSIS — E039 Hypothyroidism, unspecified: Secondary | ICD-10-CM

## 2022-06-24 LAB — THYROID STIMULATING HORMONE WITH FREE T4 REFLEX: TSH: 2.691 u[IU]/mL (ref 0.350–4.940)

## 2022-06-25 LAB — THYROPEROXIDASE (TPO) ANTIBODIES, SERUM: ANTI THYROPEROXIDASE ANTIBODIES: 7 IU/mL (ref <=14)

## 2022-08-24 ENCOUNTER — Encounter (HOSPITAL_BASED_OUTPATIENT_CLINIC_OR_DEPARTMENT_OTHER): Payer: Self-pay | Admitting: Physician Assistant

## 2022-09-13 ENCOUNTER — Other Ambulatory Visit: Payer: Self-pay | Admitting: Physician Assistant

## 2022-09-13 DIAGNOSIS — F410 Panic disorder [episodic paroxysmal anxiety] without agoraphobia: Secondary | ICD-10-CM

## 2022-09-13 DIAGNOSIS — F411 Generalized anxiety disorder: Secondary | ICD-10-CM

## 2022-09-13 DIAGNOSIS — F331 Major depressive disorder, recurrent, moderate: Secondary | ICD-10-CM

## 2022-09-19 ENCOUNTER — Encounter (HOSPITAL_BASED_OUTPATIENT_CLINIC_OR_DEPARTMENT_OTHER): Payer: Self-pay | Admitting: Physician Assistant

## 2022-09-19 ENCOUNTER — Other Ambulatory Visit: Payer: Self-pay | Admitting: Physician Assistant

## 2022-09-19 DIAGNOSIS — F331 Major depressive disorder, recurrent, moderate: Secondary | ICD-10-CM

## 2022-09-19 DIAGNOSIS — F411 Generalized anxiety disorder: Secondary | ICD-10-CM

## 2022-09-19 DIAGNOSIS — F410 Panic disorder [episodic paroxysmal anxiety] without agoraphobia: Secondary | ICD-10-CM

## 2022-09-20 ENCOUNTER — Other Ambulatory Visit (HOSPITAL_BASED_OUTPATIENT_CLINIC_OR_DEPARTMENT_OTHER): Payer: Self-pay | Admitting: Physician Assistant

## 2022-09-20 DIAGNOSIS — F411 Generalized anxiety disorder: Secondary | ICD-10-CM

## 2022-09-20 MED ORDER — PROPRANOLOL 10 MG TABLET
20.0000 mg | ORAL_TABLET | Freq: Three times a day (TID) | ORAL | 3 refills | Status: DC
Start: 2022-09-20 — End: 2023-06-02

## 2022-09-20 NOTE — Telephone Encounter (Signed)
Call transferred for triage from Call Center,   Patient c/o being out Paxil, has not had any since Thursday     Pt having brain zaps, withdrawal symptoms of not having the Paxil and also nausea. Patient has had these symptoms before.    Urgent care precautions reviewed with patient.   Patient verbalized understanding.     Sending high priority refill request.     Shona Simpson, RN  09/20/2022  10:42

## 2022-09-20 NOTE — Telephone Encounter (Signed)
From: Clarnce Flock  Sent: 09/19/2022 5:29 PM EDT  To: [facility] Provider Nurses  Subject: Referral    Hi, this is a checkin from the message and to see if an appointment has been made.     I also need my paroxetine refilled. My pharmacy indicated a refill request was sent in on July 1 but no response. I have no pills left and am starting to go through withdrawal symptoms so I hope to get this filled quickly.     Thanks so much and I hope everyone had a happy holiday weekend.

## 2022-09-20 NOTE — Telephone Encounter (Signed)
-----   Message from Clarnce Flock sent at 09/19/2022  7:17 PM EDT -----  Regarding: Propranolol refill  Contact: [phone]  Hi, I need a refill for propranolol. It is not available to request to refill on my chart and for some reason it is showing up as being filled by a doctor I don't use anymore. I am calling [facility] in the morning to address this and I have sent a message to your nurse but I am also sending a message to you.     Thanks so much.

## 2022-09-21 MED ORDER — PAROXETINE 40 MG TABLET
40.0000 mg | ORAL_TABLET | Freq: Every morning | ORAL | 1 refills | Status: DC
Start: 2022-09-21 — End: 2023-06-02

## 2022-09-21 MED ORDER — PAROXETINE 40 MG TABLET
40.0000 mg | ORAL_TABLET | Freq: Every morning | ORAL | 3 refills | Status: DC
Start: 2022-09-21 — End: 2023-09-28

## 2022-09-23 IMAGING — DX DG FINGER INDEX 2+V*R*
3 series · 3 of 3 positions shown · non-contrast
Comparison: None.

CLINICAL DATA: Index finger PIP joint pain for 3 weeks

EXAM:
RIGHT INDEX FINGER 2+V

[finger ap]
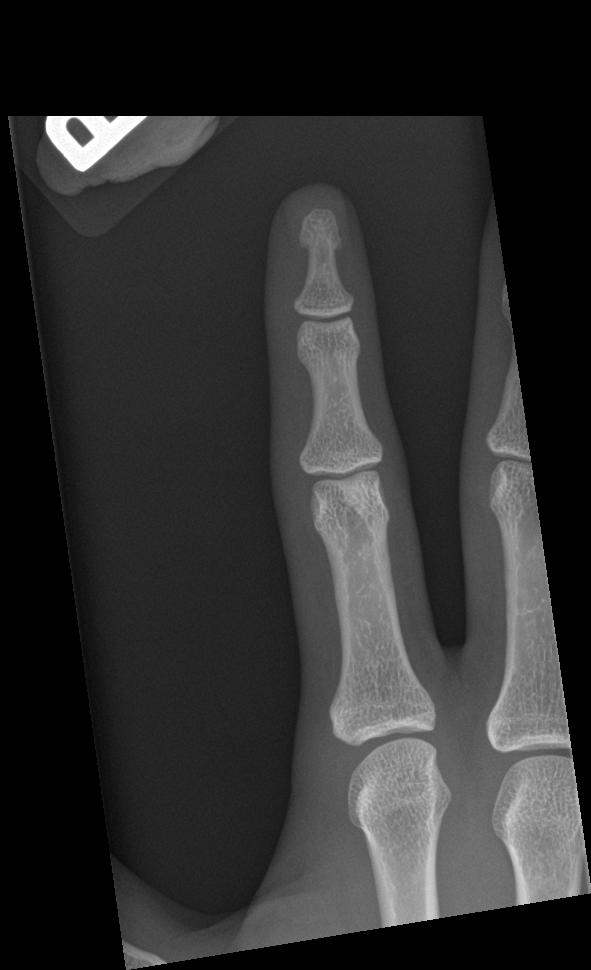

[finger obl]
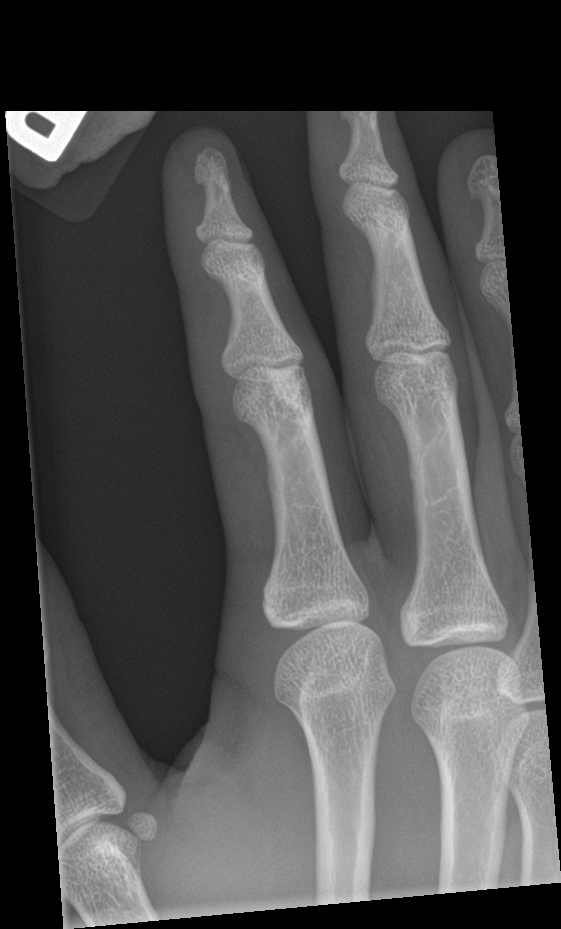

[finger lat]
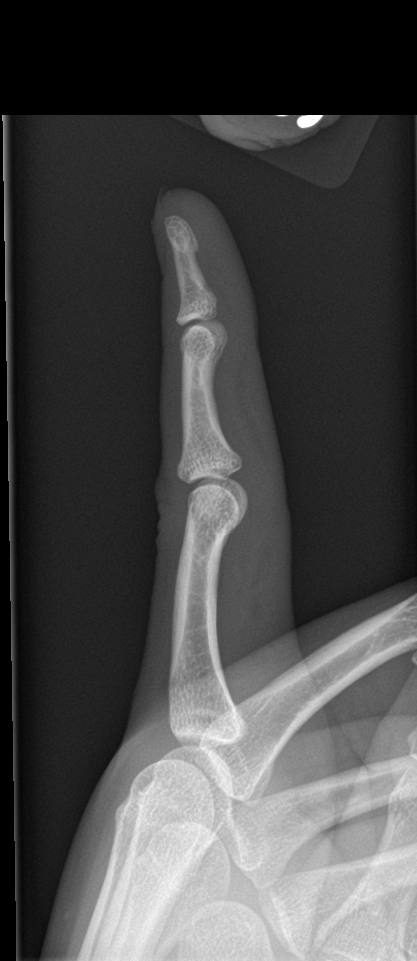

[3 of 3 positions shown; findings below may reference images not displayed]

FINDINGS: There is no evidence of fracture or dislocation. There is no
evidence of arthropathy or other focal bone abnormality. Soft
tissues are unremarkable.
IMPRESSION: No acute osseous finding.

## 2022-11-18 ENCOUNTER — Encounter (HOSPITAL_BASED_OUTPATIENT_CLINIC_OR_DEPARTMENT_OTHER): Payer: Self-pay | Admitting: Physician Assistant

## 2022-12-28 ENCOUNTER — Ambulatory Visit (HOSPITAL_BASED_OUTPATIENT_CLINIC_OR_DEPARTMENT_OTHER): Payer: Self-pay | Admitting: Physician Assistant

## 2022-12-28 NOTE — Telephone Encounter (Signed)
Patient had endocriniology referral and also saw them in Jan 2024, should not need another referral.   Expand All Collapse All    Department of Endocrinology  New Patient Visit     Date:     03/19/2022  Name:   Nichole [name]  Age:      32 y.o.  MRN:   [id number]     Chief Complaint: Hypothyroidism     History of Present Illness  [name] is a 32 y.o. female with PMH of hypothyroidism who presents to Endocrinology Clinic as a referral from pcp for evaluation.          Attempted to reach patient to inquire need for OB/GYN referral and triage.   No answer, left message to return call.   Shona Simpson, RN  12/28/2022  15:04

## 2022-12-28 NOTE — Telephone Encounter (Signed)
Regarding: Referral Question  ----- Message from Salvadore Oxford sent at 12/28/2022 12:11 PM EDT -----  Copied From CRM [phone].[name], Nichole called with question(s) about a referral.  Pt is following up on  referral for Endrocrinology please call back advise  pt is also requesting a referral for OBY/GYN

## 2023-02-17 ENCOUNTER — Other Ambulatory Visit (HOSPITAL_BASED_OUTPATIENT_CLINIC_OR_DEPARTMENT_OTHER): Payer: Self-pay | Admitting: Physician Assistant

## 2023-02-17 DIAGNOSIS — E039 Hypothyroidism, unspecified: Secondary | ICD-10-CM

## 2023-03-23 ENCOUNTER — Encounter (INDEPENDENT_AMBULATORY_CARE_PROVIDER_SITE_OTHER): Payer: Self-pay | Admitting: Obstetrics & Gynecology

## 2023-03-23 ENCOUNTER — Other Ambulatory Visit: Payer: Self-pay

## 2023-03-23 ENCOUNTER — Ambulatory Visit (INDEPENDENT_AMBULATORY_CARE_PROVIDER_SITE_OTHER): Payer: PRIVATE HEALTH INSURANCE | Admitting: Obstetrics & Gynecology

## 2023-03-23 ENCOUNTER — Ambulatory Visit: Payer: PRIVATE HEALTH INSURANCE | Attending: Obstetrics & Gynecology | Admitting: Obstetrics & Gynecology

## 2023-03-23 VITALS — BP 117/72 | Wt 124.9 lb

## 2023-03-23 DIAGNOSIS — Z124 Encounter for screening for malignant neoplasm of cervix: Secondary | ICD-10-CM

## 2023-03-23 DIAGNOSIS — N939 Abnormal uterine and vaginal bleeding, unspecified: Secondary | ICD-10-CM

## 2023-03-23 DIAGNOSIS — E039 Hypothyroidism, unspecified: Secondary | ICD-10-CM

## 2023-03-23 DIAGNOSIS — Z01419 Encounter for gynecological examination (general) (routine) without abnormal findings: Secondary | ICD-10-CM

## 2023-03-23 NOTE — Progress Notes (Signed)
Department of Obstetrics & Gynecology  Annual Exam    Name: Raeann Melanson  MRN: [id number]  Date: 03/23/23    Subjective:      Martina Weinreich is a 33 y.o. female here for routine annual exam. Stopped OCPs a few years ago but is interested in restarting as her menses have been frequent over the past year, sometimes twice a month. Denies breast concerns.      Gynecologic/Preventive History  Patient's last menstrual period was 03/05/2023 (exact date).  Current Contraception: none  Last Pap: reports approx 4-5 years ago, reports hx of abnormal pap several years ago   Last Mammogram: N/A  Last colonoscopy: N/A  Last bone density: N/A    History  OB History       Gravida   0    Para   0    Term   0    Preterm   0    AB   0    Living   0         SAB   0    IAB   0    Ectopic   0    Multiple   0    Live Births   0               Past Medical History:   Diagnosis Date    Anxiety     Hypertension     Hypothyroidism          Past Surgical History:   Procedure Laterality Date    HX WISDOM TEETH EXTRACTION            Family Medical History:       Problem Relation (Age of Onset)    Anxiety Mother    Diabetes type II Father    Healthy Brother    Hypertension (High Blood Pressure) Mother, Father, Maternal Grandfather, Paternal Grandmother, Paternal Grandfather    Osteoporosis Maternal Grandmother    Prostate Cancer Maternal Grandfather    Thyroid Disease Father, Paternal Grandmother            Current Outpatient Medications   Medication Sig Dispense Refill    hydrOXYzine HCL (ATARAX) 25 mg Oral Tablet Take 1 Tablet (25 mg total) by mouth Every 8 hours as needed for Anxiety (Patient not taking: Reported on 02/19/2022) 30 Tablet 1    levothyroxine (SYNTHROID) 175 mcg Oral Tablet Take 1 Tablet (175 mcg total) by mouth Every morning 90 Tablet 1    PARoxetine (PAXIL) 40 mg Oral Tablet Take 1 Tablet (40 mg total) by mouth Every morning 90 Tablet 3    PARoxetine (PAXIL) 40 mg Oral Tablet Take 1 Tablet (40 mg total) by mouth Every  morning 90 Tablet 1    propranoloL (INDERAL) 10 mg Oral Tablet Take 2 Tablets (20 mg total) by mouth Three times a day 540 Tablet 3     No current facility-administered medications for this visit.     Allergies   Allergen Reactions    Amoxicillin Rash    Benzoyl Peroxide Itching, Photosensitivity, Rash and Swelling    Nickel Rash     Social History     Socioeconomic History    Marital status: Married     Spouse name: Not on file    Number of children: Not on file    Years of education: Not on file    Highest education level: Not on file   Occupational History    Not on file  Tobacco Use    Smoking status: Never    Smokeless tobacco: Never    Tobacco comments:     once a month   Vaping Use    Vaping status: Never Used   Substance and Sexual Activity    Alcohol use: Yes     Comment: 1-2 glasses/ week    Drug use: Never    Sexual activity: Not on file   Other Topics Concern    Not on file   Social History Narrative    Not on file     Social Determinants of Health     Financial Resource Strain: Not on file   Transportation Needs: Not on file   Social Connections: Not on file   Intimate Partner Violence: Not on file   Housing Stability: Not on file       Review of Systems  Negative except as noted in HPI    Objective:   BP 117/72   Wt 56.7 kg (124 lb 14.4 oz)   LMP 03/05/2023 (Exact Date)   BMI 25.23 kg/m     Exam chaperoned by: Read Drivers, MA    General:     No acute distress, appears well.    HEENT:  Normocephalic.   Breasts:  Normal without suspicious masses, skin or nipple changes or axillary nodes.   Abdomen:  Soft, non-tender. No masses,  no organomegaly   Extremities:  Extremities normal, atraumatic, no cyanosis or edema.   Psych:  Oriented, normal mood. Appropriate responses.    Vulva:  Normal female genitalia, no lesions    Vagina:  Normal mucosa, no discharge, no  lesions    Cervix:  Normal with no lesions/discharge    Uterus:  Small, non-tender   Left Adnexa:  Normal, Non tender, no masses   Right Adnexa:   Normal, Non tender, no masses     Assessment:     33 y.o. G0P0000 F presenting for routine gyn exam.    Plan:     Contraception: none  Considering OCPs pending [place] results  Pap: collected  STI screening: denies concerns   AUB: TVUS, TSH ordered    Follow up: 1 year for annual exam, before PRN    Bethann Humble, MD 03/23/2023 09:06  [facility]  Department of Obstetrics & Gynecology

## 2023-03-25 ENCOUNTER — Ambulatory Visit (INDEPENDENT_AMBULATORY_CARE_PROVIDER_SITE_OTHER): Payer: PRIVATE HEALTH INSURANCE | Admitting: Obstetrics & Gynecology

## 2023-03-31 ENCOUNTER — Ambulatory Visit (INDEPENDENT_AMBULATORY_CARE_PROVIDER_SITE_OTHER): Payer: Self-pay | Admitting: Obstetrics & Gynecology

## 2023-04-04 DIAGNOSIS — Z124 Encounter for screening for malignant neoplasm of cervix: Secondary | ICD-10-CM

## 2023-04-04 LAB — CYTOPATHOLOGY, GYN +/- HIGH RISK HPV

## 2023-04-05 LAB — HUMAN PAPILLOMA VIRUS (HPV) BY PCR WITH HIGH RISK GENOTYPING (THINPREP)
HPV OTHER: NEGATIVE
HPV16 PCR: NEGATIVE
HPV18 PCR: NEGATIVE

## 2023-04-07 ENCOUNTER — Ambulatory Visit (HOSPITAL_BASED_OUTPATIENT_CLINIC_OR_DEPARTMENT_OTHER): Payer: Self-pay | Admitting: Student in an Organized Health Care Education/Training Program

## 2023-04-08 ENCOUNTER — Other Ambulatory Visit: Payer: Self-pay

## 2023-04-08 ENCOUNTER — Other Ambulatory Visit (INDEPENDENT_AMBULATORY_CARE_PROVIDER_SITE_OTHER): Payer: PRIVATE HEALTH INSURANCE

## 2023-04-08 ENCOUNTER — Ambulatory Visit: Payer: PRIVATE HEALTH INSURANCE | Attending: Obstetrics & Gynecology | Admitting: Obstetrics & Gynecology

## 2023-04-08 DIAGNOSIS — N854 Malposition of uterus: Secondary | ICD-10-CM

## 2023-04-08 DIAGNOSIS — E039 Hypothyroidism, unspecified: Secondary | ICD-10-CM

## 2023-04-08 DIAGNOSIS — N939 Abnormal uterine and vaginal bleeding, unspecified: Secondary | ICD-10-CM

## 2023-04-08 LAB — THYROXINE, FREE (FREE T4): THYROXINE (T4), FREE: 0.74 ng/dL (ref 0.70–1.48)

## 2023-04-08 LAB — THYROID STIMULATING HORMONE WITH FREE T4 REFLEX: TSH: 59.398 u[IU]/mL — ABNORMAL HIGH (ref 0.350–4.940)

## 2023-04-11 ENCOUNTER — Ambulatory Visit (HOSPITAL_BASED_OUTPATIENT_CLINIC_OR_DEPARTMENT_OTHER): Payer: Self-pay | Admitting: Student in an Organized Health Care Education/Training Program

## 2023-04-19 ENCOUNTER — Encounter (INDEPENDENT_AMBULATORY_CARE_PROVIDER_SITE_OTHER): Payer: Self-pay

## 2023-04-19 ENCOUNTER — Encounter (INDEPENDENT_AMBULATORY_CARE_PROVIDER_SITE_OTHER): Payer: Self-pay | Admitting: Obstetrics & Gynecology

## 2023-04-20 ENCOUNTER — Other Ambulatory Visit (HOSPITAL_BASED_OUTPATIENT_CLINIC_OR_DEPARTMENT_OTHER): Payer: Self-pay | Admitting: Physician Assistant

## 2023-04-20 ENCOUNTER — Ambulatory Visit (INDEPENDENT_AMBULATORY_CARE_PROVIDER_SITE_OTHER): Payer: Self-pay | Admitting: Obstetrics & Gynecology

## 2023-04-20 DIAGNOSIS — E039 Hypothyroidism, unspecified: Secondary | ICD-10-CM

## 2023-04-21 MED ORDER — LEVOTHYROXINE 175 MCG TABLET
175.0000 ug | ORAL_TABLET | Freq: Every morning | ORAL | 1 refills | Status: AC
Start: 2023-04-21 — End: ?

## 2023-05-03 ENCOUNTER — Telehealth (INDEPENDENT_AMBULATORY_CARE_PROVIDER_SITE_OTHER): Payer: PRIVATE HEALTH INSURANCE | Admitting: Obstetrics & Gynecology

## 2023-05-03 DIAGNOSIS — N840 Polyp of corpus uteri: Secondary | ICD-10-CM

## 2023-05-03 DIAGNOSIS — E039 Hypothyroidism, unspecified: Secondary | ICD-10-CM

## 2023-05-03 DIAGNOSIS — N939 Abnormal uterine and vaginal bleeding, unspecified: Secondary | ICD-10-CM

## 2023-05-03 NOTE — Progress Notes (Signed)
TELEMEDICINE DOCUMENTATION:    Patient Location:  MyChart video visit from home address: [address]  [place] [address]    Patient/family aware of provider location:  yes  Patient/family consent for telemedicine:  yes  Examination observed and performed by:  Bethann Humble, MD                 [facility] of Obstetrics & Gynecology      Return Gynecology Progress Note    PATIENT:  Nichole [name]   MRN:  [id number]   DATE OF SERVICE:  05/03/2023, 08:55    Subjective:  33 y.o. G0P0000 presenting for return GYN visit to discuss results. Was seen for frequent menses/intermenstrual bleeding. TSH was 59 and US showed endometrial and endocervical polyps. She has pending appt with endocrinology in 2 weeks.     Objective:    There were no vitals filed for this visit.    GEN:  NAD, well-appearing  PSYCH: normal affect          A/P:  33 y.o. G0P0000     Discussed [place] results with endometrial and endocervical/LUS polyps noted  Discussed treatment options including expectant, hormonal, and surgical with hysteroscopy/polypectomy  Patient is scheduled with endocrinology regarding possible thyroid nodule and abnormal TSH in 2 weeks   She elects to have endocrinology evaluation and assess the plan regarding that  If she desires to have surgery or start OCPs, she will reach out via MyChart     Bethann Humble, MD 05/03/2023 08:55  [facility]  Department of Obstetrics & Gynecology

## 2023-05-16 ENCOUNTER — Ambulatory Visit (HOSPITAL_BASED_OUTPATIENT_CLINIC_OR_DEPARTMENT_OTHER)
Payer: PRIVATE HEALTH INSURANCE | Attending: Student in an Organized Health Care Education/Training Program | Admitting: Student in an Organized Health Care Education/Training Program

## 2023-05-16 ENCOUNTER — Other Ambulatory Visit: Payer: Self-pay

## 2023-05-16 ENCOUNTER — Encounter (HOSPITAL_BASED_OUTPATIENT_CLINIC_OR_DEPARTMENT_OTHER): Payer: Self-pay | Admitting: Student in an Organized Health Care Education/Training Program

## 2023-05-16 ENCOUNTER — Ambulatory Visit (HOSPITAL_BASED_OUTPATIENT_CLINIC_OR_DEPARTMENT_OTHER): Payer: PRIVATE HEALTH INSURANCE

## 2023-05-16 VITALS — BP 122/71 | HR 134 | Ht 59.0 in | Wt 120.4 lb

## 2023-05-16 DIAGNOSIS — E039 Hypothyroidism, unspecified: Secondary | ICD-10-CM | POA: Insufficient documentation

## 2023-05-16 DIAGNOSIS — R0989 Other specified symptoms and signs involving the circulatory and respiratory systems: Secondary | ICD-10-CM | POA: Insufficient documentation

## 2023-05-16 LAB — THYROID STIMULATING HORMONE WITH FREE T4 REFLEX: TSH: 1.486 u[IU]/mL (ref 0.350–4.940)

## 2023-05-16 NOTE — Progress Notes (Signed)
 Department of Endocrinology  Return    Date:   05/16/2023  Name: Nichole [name]  Age: 33 y.o.  MRN: [id number]    Chief Complaint: Hypothyroidism    History of Present Illness  Nichole [name] is a 33 y.o. female with PMH of hypothyroidism who presented to Endocrinology Clinic initially for her thyroid nodule. Re-Establishing today for recent TSH elevation    She has a history of hypothyroidism which was diagnosed at the age of 1. There is positive family history of hypothyroidism on her father's side. No family history of thyroid cancer.     On Levothyroxine 175 mcg daily    TSH 59.398 Jan 25    May have skipped few doses 2-3 times in a month  First thing in the morning  Waits 30-40 min before coffee  Takes Vit D 3 with it  No PPI or reflux medicine within it    She had a thyroid ultrasound ordered by her PCP on 02/22/2022 which showed- Mildly hypoechoic ovoid focus inferior to the left thyroid gland measures 0.3 cm in short axis and is suggestive of central compartmental lymph node     [place] was done for thyroid fullness    Family history of thyroid cancer - None  History of neck radiation - None  Compressive symptoms - Reports trouble swallowing since 6 months food getting stuck    Not on contraception  Reports heavy irregular cycles since the last one year being followed by ObGyn    Review of Systems:   All systems reviewed and negative unless otherwise noted above in HPI.    Past Medical History:    Past Medical History:   Diagnosis Date    Anxiety     Hypertension     Hypothyroidism        Past Surgical History:    Past Surgical History:   Procedure Laterality Date    HX WISDOM TEETH EXTRACTION         Medications:  Current Outpatient Medications   Medication Sig    hydrOXYzine HCL (ATARAX) 25 mg Oral Tablet Take 1 Tablet (25 mg total) by mouth Every 8 hours as needed for Anxiety (Patient not taking: Reported on 02/19/2022)    levothyroxine (SYNTHROID) 175 mcg Oral Tablet Take 1 Tablet (175 mcg total) by mouth  Every morning    PARoxetine (PAXIL) 40 mg Oral Tablet Take 1 Tablet (40 mg total) by mouth Every morning    PARoxetine (PAXIL) 40 mg Oral Tablet Take 1 Tablet (40 mg total) by mouth Every morning    propranoloL (INDERAL) 10 mg Oral Tablet Take 2 Tablets (20 mg total) by mouth Three times a day       Allergies:  Allergies   Allergen Reactions    Amoxicillin Rash    Benzoyl Peroxide Itching, Photosensitivity, Rash and Swelling    Nickel Rash       Family History:    Family Medical History:       Problem Relation (Age of Onset)    Anxiety Mother    Diabetes type II Father    Healthy Brother    Hypertension (High Blood Pressure) Mother, Father, Maternal Grandfather, Paternal Grandmother, Paternal Grandfather    Osteoporosis Maternal Grandmother    Prostate Cancer Maternal Grandfather    Thyroid Disease Father, Paternal Grandmother            Social History:    Social History     Tobacco Use    Smoking status: Never  Smokeless tobacco: Never    Tobacco comments:     once a month   Vaping Use    Vaping status: Never Used   Substance Use Topics    Alcohol use: Yes     Comment: 1-2 glasses/ week    Drug use: Never           Physical Exam   BP 122/71   Pulse (!) 134   Ht 1.499 m (4\' 11" )   Wt 54.6 kg (120 lb 5.9 oz)   SpO2 100%   BMI 24.31 kg/m        General: Well appearing female  Neuro: Alert and oriented x 3. Speech fluent.   HEENT: Head normocephalic, atraumatic.   Neck: Normal range of motion  Lungs: Normal respiratory effort.   Cardiac: HR 134  Abdomen: Non-distended  Extremities: No edema  Skin: Warm and dry.      Data reviewed:      No results found for: "HA1C"  Lab Results   Component Value Date    TSH 59.398 (H) 04/08/2023    FREET4 0.74 04/08/2023     Thyroid US  IMPRESSION:  1.Atrophied and hypovascular thyroid.  2.Hypoechoic focus inferior to the left thyroid gland. This is suggestive of central compartment lymph node    Assessment and Plan:  Nichole [name] is a 33 y.o. female with PMH of  hypothyroidism who presents to Endocrinology Clinic as a referral from pcp for evaluation.    1) Hypothyroidism  History of hypothyroidism which was diagnosed at the age of 1.   Positive family history of hypothyroidism on her father's side.   Last TSH 59.398 Jan 25  On Levothyroxine 175 mcg daily - reports compliance and good technique    -Continue levothyroxine 175 mcg qAM at this time and will adjust based on repeat TSH level today  -Screen for Celiac    Advised to use contraception as thyroid levels uncontrolled    I discussed with the patient the diagnosis, evaluation, treatment and prognosis of hypothyroidism.  I also discussed the function of thyroid and how thyroid affects metabolism and the signs and symptoms of hypothyroidism.  I encouraged the patient to take her medication as prescribed, reviewing the importance of continuing levothyroxine to avoid symptomatic hypothyroidism.      The patient has been advised to take the levothyroxine regularly and daily on an empty stomach without any other foods or pills for 30 minutes.  She has verbalized understanding.      2. Lymph node symptom  She had a thyroid ultrasound ordered by her PCP on 02/22/2022 which showed- Mildly hypoechoic ovoid focus inferior to the left thyroid gland measures 0.3 cm in short axis and is suggestive of central compartmental lymph node     -Repeat Ultrasound    Orders Placed This Encounter    US THYROID    CELIAC SCREENING PROFILE, IGA WITH REFLEX TO IGG, SERUM    THYROID STIMULATING HORMONE WITH FREE T4 REFLEX     Real Cons, MD  Assistant Professor  Endocrinology & Metabolism  [facility]    05/16/2023 14:52

## 2023-05-18 LAB — CELIAC SCREENING PROFILE, IGA WITH REFLEX TO IGG, SERUM
GLIADIN (DEAMIDATED) ANTIBODY IGA QUALITATIVE: NEGATIVE
GLIADIN (DEAMIDATED) ANTIBODY IGA QUANTITATIVE: <0.2 U/mL (ref <15.0)
TISSUE TRANSGLUTAMINASE ANTIBODIES IGA QUALITATIVE: NEGATIVE
TISSUE TRANSGLUTAMINASE ANTIBODIES IGA QUANTITATIVE: <0.5 U/mL (ref <15.0)

## 2023-05-19 ENCOUNTER — Ambulatory Visit (HOSPITAL_BASED_OUTPATIENT_CLINIC_OR_DEPARTMENT_OTHER): Payer: Self-pay | Admitting: Student in an Organized Health Care Education/Training Program

## 2023-05-19 DIAGNOSIS — E039 Hypothyroidism, unspecified: Secondary | ICD-10-CM

## 2023-05-25 ENCOUNTER — Other Ambulatory Visit: Payer: Self-pay

## 2023-05-25 ENCOUNTER — Ambulatory Visit
Admission: RE | Admit: 2023-05-25 | Discharge: 2023-05-25 | Disposition: A | Discharge status: Home / Self Care | Payer: PRIVATE HEALTH INSURANCE | Source: Ambulatory Visit | Attending: Student in an Organized Health Care Education/Training Program | Admitting: Student in an Organized Health Care Education/Training Program

## 2023-05-25 DIAGNOSIS — R0989 Other specified symptoms and signs involving the circulatory and respiratory systems: Secondary | ICD-10-CM | POA: Insufficient documentation

## 2023-05-25 DIAGNOSIS — R946 Abnormal results of thyroid function studies: Secondary | ICD-10-CM

## 2023-05-25 DIAGNOSIS — E034 Atrophy of thyroid (acquired): Secondary | ICD-10-CM

## 2023-05-30 ENCOUNTER — Ambulatory Visit (HOSPITAL_BASED_OUTPATIENT_CLINIC_OR_DEPARTMENT_OTHER): Payer: PRIVATE HEALTH INSURANCE | Admitting: Physician Assistant

## 2023-06-02 ENCOUNTER — Encounter (HOSPITAL_BASED_OUTPATIENT_CLINIC_OR_DEPARTMENT_OTHER): Payer: Self-pay | Admitting: Physician Assistant

## 2023-06-02 ENCOUNTER — Other Ambulatory Visit: Payer: Self-pay

## 2023-06-02 ENCOUNTER — Ambulatory Visit: Payer: PRIVATE HEALTH INSURANCE | Attending: Physician Assistant | Admitting: Physician Assistant

## 2023-06-02 VITALS — BP 102/72 | HR 79 | Temp 97.9°F | Resp 17 | Ht 59.0 in | Wt 119.3 lb

## 2023-06-02 DIAGNOSIS — F411 Generalized anxiety disorder: Secondary | ICD-10-CM | POA: Insufficient documentation

## 2023-06-02 DIAGNOSIS — N946 Dysmenorrhea, unspecified: Secondary | ICD-10-CM | POA: Insufficient documentation

## 2023-06-02 DIAGNOSIS — Z309 Encounter for contraceptive management, unspecified: Secondary | ICD-10-CM | POA: Insufficient documentation

## 2023-06-02 DIAGNOSIS — E063 Autoimmune thyroiditis: Secondary | ICD-10-CM | POA: Insufficient documentation

## 2023-06-02 DIAGNOSIS — E039 Hypothyroidism, unspecified: Secondary | ICD-10-CM | POA: Insufficient documentation

## 2023-06-02 MED ORDER — LOESTRIN FE 1/20 (28-DAY) 1 MG-20 MCG (21)/75 MG (7) TABLET
1.0000 | ORAL_TABLET | Freq: Every day | ORAL | 11 refills | Status: DC
Start: 2023-06-02 — End: 2023-06-02

## 2023-06-02 MED ORDER — LOESTRIN FE 1/20 (28-DAY) 1 MG-20 MCG (21)/75 MG (7) TABLET
1.0000 | ORAL_TABLET | Freq: Every day | ORAL | 3 refills | Status: AC
Start: 2023-06-02 — End: ?

## 2023-06-02 NOTE — Progress Notes (Signed)
 Department of Family Medicine   Progress Note    Amethyst Gainer  MRN: [id number]  DOB: 1990-05-04  Date of Service: 06/02/2023    CHIEF COMPLAINT  Chief Complaint   Patient presents with    Follow Up     Thyroid ultrasound follow up; discuss birth control options        SUBJECTIVE  Nichole [name] is a 33 y.o. female who presents to clinic for routine monitoring.    Doing very well overall. She established with a new endocrinologist for management and further understanding of her Hashimoto's disease. Has been compliant with levothyroxine. Denies any breakthrough symptoms.     Anxiety has been well controlled with Paxil. Has not needed propranolol or hydroxyzine. School has been stressful, but she is managing well.     She has been having dysmenorrhea. Previously on OCP that she tolerated well and helped manage her symptoms. Interested in restarting it.     Review of Systems:  Positive ROS discussed in HPI, otherwise all other systems negative.      Medications:   hydrOXYzine HCL (ATARAX) 25 mg Oral Tablet, Take 1 Tablet (25 mg total) by mouth Every 8 hours as needed for Anxiety (Patient not taking: Reported on 02/19/2022)  levothyroxine (SYNTHROID) 175 mcg Oral Tablet, Take 1 Tablet (175 mcg total) by mouth Every morning  PARoxetine (PAXIL) 40 mg Oral Tablet, Take 1 Tablet (40 mg total) by mouth Every morning  PARoxetine (PAXIL) 40 mg Oral Tablet, Take 1 Tablet (40 mg total) by mouth Every morning (Patient not taking: Reported on 06/02/2023)  propranoloL (INDERAL) 10 mg Oral Tablet, Take 2 Tablets (20 mg total) by mouth Three times a day (Patient not taking: Reported on 06/02/2023)    No facility-administered medications prior to visit.      Allergies:   Allergies   Allergen Reactions    Amoxicillin Rash    Benzoyl Peroxide Itching, Photosensitivity, Rash and Swelling    Nickel Rash         OBJECTIVE  BP 102/72   Pulse 79   Temp 36.6 C (97.9 F) (Thermal Scan)   Resp 17   Ht 1.499 m (4\' 11" )   Wt 54.1 kg  (119 lb 4.3 oz)   SpO2 100%   BMI 24.09 kg/m       General: no distress  Neck: thyromegaly appreciated without palpable nodules or lymph nodes  Lungs: clear to auscultation bilaterally  Cardiovascular: RRR, no murmur  Extremities: no cyanosis or edema  Skin: warm and dry, no rash  Neurologic: gait is normal, AOx3  Psychiatric: normal affect and behavior    ASSESSMENT/PLAN  (E06.3) Hashimoto's disease  (primary encounter diagnosis)  (E03.9) Hypothyroidism, unspecified type  Plan:   Chronic, stable   Continue levothyroxine 175 mcg     (F41.1) GAD (generalized anxiety disorder)  Plan:   Chronic, stable   Continue Paxil   Will keep hydroxyzine prn    (N94.6) Dysmenorrhea  (Z30.9) Encounter for contraceptive management, unspecified type  Plan:   Chronic, uncontrolled  Norethindrn A-E Estradiol-Iron (LOESTRIN FE         1/20, 28-DAY,) 1 mg-20 mcg (21)/75 mg (7) Oral         Tablet      Orders Placed This Encounter    Norethindrn A-E Estradiol-Iron (LOESTRIN FE 1/20, 28-DAY,) 1 mg-20 mcg (21)/75 mg (7) Oral Tablet         Return in about 6 months (around 12/03/2023) for Annual physical (40 min appt).  Ovidio Hanger, PA-C 06/02/2023, 12:56

## 2023-07-14 ENCOUNTER — Ambulatory Visit (HOSPITAL_BASED_OUTPATIENT_CLINIC_OR_DEPARTMENT_OTHER): Payer: PRIVATE HEALTH INSURANCE | Admitting: Physician Assistant

## 2023-09-25 ENCOUNTER — Other Ambulatory Visit (HOSPITAL_BASED_OUTPATIENT_CLINIC_OR_DEPARTMENT_OTHER): Payer: Self-pay | Admitting: Student in an Organized Health Care Education/Training Program

## 2023-10-17 ENCOUNTER — Ambulatory Visit (HOSPITAL_BASED_OUTPATIENT_CLINIC_OR_DEPARTMENT_OTHER): Payer: PRIVATE HEALTH INSURANCE | Admitting: Physician Assistant

## 2023-12-05 ENCOUNTER — Ambulatory Visit (HOSPITAL_BASED_OUTPATIENT_CLINIC_OR_DEPARTMENT_OTHER): Payer: Self-pay | Admitting: Physician Assistant

## 2023-12-29 ENCOUNTER — Ambulatory Visit (HOSPITAL_BASED_OUTPATIENT_CLINIC_OR_DEPARTMENT_OTHER): Payer: PRIVATE HEALTH INSURANCE | Admitting: Physician Assistant

## 2024-01-20 ENCOUNTER — Encounter (HOSPITAL_BASED_OUTPATIENT_CLINIC_OR_DEPARTMENT_OTHER): Payer: Self-pay | Admitting: Physician Assistant

## 2024-01-20 ENCOUNTER — Ambulatory Visit: Payer: PRIVATE HEALTH INSURANCE | Attending: Physician Assistant | Admitting: Physician Assistant

## 2024-01-20 ENCOUNTER — Other Ambulatory Visit: Payer: Self-pay

## 2024-01-20 VITALS — BP 116/78 | HR 88 | Temp 98.2°F | Ht 58.98 in | Wt 124.8 lb

## 2024-01-20 DIAGNOSIS — F39 Unspecified mood [affective] disorder: Secondary | ICD-10-CM

## 2024-01-20 DIAGNOSIS — Z0000 Encounter for general adult medical examination without abnormal findings: Secondary | ICD-10-CM

## 2024-01-20 DIAGNOSIS — F411 Generalized anxiety disorder: Secondary | ICD-10-CM

## 2024-01-20 DIAGNOSIS — N898 Other specified noninflammatory disorders of vagina: Secondary | ICD-10-CM

## 2024-01-20 DIAGNOSIS — R509 Fever, unspecified: Secondary | ICD-10-CM

## 2024-01-20 DIAGNOSIS — E063 Autoimmune thyroiditis: Secondary | ICD-10-CM

## 2024-01-20 DIAGNOSIS — R300 Dysuria: Secondary | ICD-10-CM

## 2024-01-20 LAB — URINALYSIS, MACROSCOPIC
BILIRUBIN: NEGATIVE mg/dL
COLOR: NORMAL
GLUCOSE: NEGATIVE mg/dL
KETONES: NEGATIVE mg/dL
NITRITE: NEGATIVE
PH: 6.5 (ref 5.0–8.0)
PROTEIN: 100 mg/dL — AB
SPECIFIC GRAVITY: 1.021 (ref 1.005–1.030)
UROBILINOGEN: NEGATIVE mg/dL

## 2024-01-20 LAB — URINALYSIS, MICROSCOPIC
RBCS: 27 /HPF — ABNORMAL HIGH (ref <6.0)
WBCS: >182 /HPF — ABNORMAL HIGH (ref <11.0)

## 2024-01-20 MED ORDER — BUPROPION HCL XL 150 MG 24 HR TABLET, EXTENDED RELEASE
150.0000 mg | ORAL_TABLET | Freq: Every day | ORAL | 1 refills | Status: AC
Start: 2024-01-20 — End: ?

## 2024-01-20 NOTE — Assessment & Plan Note (Addendum)
 Chronic, stable   Orders:    buPROPion  (WELLBUTRIN  XL) 150 mg extended release 24 hr tablet; Take 1 Tablet (150 mg total) by mouth Daily  -Discussed adding Wellbutrin  150mg  XL to further manage depressive symptoms. Patient is considering this, but prefers to see what TSH levels show first.   -Continue Paxil  40 mg daily for management of anxious symptoms.   -Continue with therapy, discussed increasing visits for further support.

## 2024-01-20 NOTE — Progress Notes (Addendum)
 [facility], [facility]  [address]  [place] [address]  Operated by [facility]  Physical    Name: Nichole [name] MRN:  [id number]   Date: 01/20/2024 DOB:  22-Jun-1990 (33 y.o.)   PCP: Rayfield Barrack, PA-C    Chief Complaint   Patient presents with    Annual Exam     Subjective   Nichole [name] is a 33 y.o. female who presents to clinic for physical exam.     She says that she has been having depressive symptoms recently. 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 She also reports that she recently went through a divorce and has financial life stressors due to that.The patient notes that she has a therapist that she talks to twice per month and has a good support system. Regarding her anxious symptoms, she feels as she is struggling with that as well. The patient has Hashimotos and would like her thyroid  checked before changing any medication.     Additionally, she reports that she is concerned that she has a UTI. She associates burning with urination, a fever (none today), vaginal itching, and urinary frequency that started 4 days ago. She denies any vaginal discharge, blood in her urine, abdominal pain, and back pain.     She says that she had a pap smear updated on 03/23/23 that was normal and HPV negative. However, she reports that she had endometrial and endocervical polyps that were found with an ultrasound. The patient states that she plans to follow up with Gyn to possibly discuss getting them surgically removed, although she is undecided about what she wants to do. She says that she was put on birth control for management of this, but is still experiencing cramping.     Medication List  hydrOXYzine  HCL (ATARAX ) 25 mg Oral Tablet, Take 1 Tablet (25 mg total) by mouth Every 8 hours as needed for Anxiety (Patient not taking:  Reported on 01/20/2024)  levothyroxine  (SYNTHROID) 175 mcg Oral Tablet, Take 1 Tablet (175 mcg total) by mouth Every morning  Norethindrn A-E Estradiol-Iron (LOESTRIN  FE 1/20, 28-DAY,) 1 mg-20 mcg (21)/75 mg (7) Oral Tablet, Take 1 Tablet by mouth Daily  PARoxetine  (PAXIL ) 40 mg Oral Tablet, Take 1 Tablet (40 mg total) by mouth Every morning    No facility-administered medications prior to visit.    Objective   BP 116/78   Pulse 88   Temp 36.8 C (98.2 F) (Thermal Scan)   Ht 1.498 m (4' 10.98)   Wt 56.6 kg (124 lb 12.5 oz)   SpO2 99%   BMI 25.22 kg/m     General: Alert, In no acute distress, Non-toxic  Eyes: Conjunctiva: clear  HENT: Normocephalic, Atraumatic, Oral exam normal  Pulmonary: Normal respiratory effort, Clear to auscultation bilaterally  Cardiovascular: Well Perfused, RRR, No murmur appreciated  Abdominal/Gastrointestinal: Soft, Non-distended, Bowel sounds normoactive, mild suprapubic tenderness to palpation  Extremities: No cyanosis, No edema bilaterally  Integumentary: Warm and dry, No rash  Neurologic: Alert and oriented, Normal speech  Psychiatric: Normal affect and behavior   Thyroid : mild thyroid  fullness, small nodule appreciated to left inferior aspect   Assessment & Plan  Annual physical exam  -Discussed following up with Gyn to further manage ultrasound findings of endometrial and cervical polyps.   -Advised the patient to stay on birth control  as it is improving her symptoms.   -Discussed regular screening labs and placed order for fasting glucose, TSH, and lipid panel.   -Patient states that she already had her annual flu vaccine.      GAD (generalized anxiety disorder)  Mood disorder (CMS HCC)  Chronic, stable   Orders:    buPROPion  (WELLBUTRIN  XL) 150 mg extended release 24 hr tablet; Take 1 Tablet (150 mg total) by mouth Daily  -Discussed adding Wellbutrin  150mg  XL to further manage depressive symptoms. Patient is considering this, but prefers to see what TSH levels show first.    -Continue Paxil  40 mg daily for management of anxious symptoms.   -Continue with therapy, discussed increasing visits for further support.   Hashimoto's disease  Chronic, stable   Orders:    TSH; Future  -Will place order for TSH to continue to monitor hypothyroidism.   -Will continue to monitor thyroid  nodule with serial ultrasounds.   Vaginal irritation  Fever, unspecified fever cause  Dysuria  Acute concern   -Orders:    VAGINITIS PATHOGENS: BACTERIAL, CANDIDA, AND TV PANEL, TMA; Future    CHLAMYDIA TRACHOMATIS/NEISSERIA GONORRHOEAE RNA, NAAT; Future    URINALYSIS, MACROSCOPIC AND MICROSCOPIC; Future    URINE CULTURE; Future  -Will complete urinalysis, urine culture, vaginitis panel, and chlamydia/gonorrhea testing due to complaints of dysuria, vaginal itching, urinary frequency, and fevers. Will follow up with results and send in antibiotic if necessary.      Healthcare Maintenance  Health Maintenance: Pending and Last Completed         Date Due Completion Date    Hepatitis C screening Never done ---    HIV Screening Never done ---    Hepatitis B Vaccine (1 of 3 - 19+ 3-dose series) Never done ---    Covid-19 Vaccine (Shared decision making) (2 - 2025-26 season) 11/14/2023 05/25/2019    NonMedicare Preventative Exam 03/22/2024 03/23/2023    Depression Screening 01/19/2025 01/20/2024    Pap smear/HPV 03/22/2028 03/23/2023    Tetanus-Diptheria Vaccines (3 - Td or Tdap) 09/29/2031 09/28/2021          Past medical history, past surgical history, family history, social history reviewed. Physical examination within normal limit unless otherwise noted. Healthcare maintenance recommendations for preserving well-being were discussed.  Depression screening is positive. Follow up plan of care: continue with Paxil  40 mg. Discussed adding Wellbutrin  XL 150mg  for further management of depressive symptoms and adding additional therapy sessions for continued support. Also discussed protective factors with patient.     Return in  about 6 months (around 07/19/2024) or sooner if needed.     Nichole [name], STUDENT PHYSICIAN ASSISTANT   I have seen and examined the patient in concert with the PA student as noted above.  The above note has been modified as necessary by me to reflect my own, personal collection/validation of the patient's HPI, patient history, physical examination, and assessment and plan.    Kimra Kantor Dalton, PA-C

## 2024-01-20 NOTE — Assessment & Plan Note (Addendum)
 Chronic, stable   Orders:    TSH; Future  -Will place order for TSH to continue to monitor hypothyroidism.   -Will continue to monitor thyroid  nodule with serial ultrasounds.

## 2024-01-20 NOTE — Nursing Note (Signed)
 01/20/24 1102   PHQ 9 (follow up)   Little interest or pleasure in doing things. 1   Feeling down, depressed, or hopeless 1   PHQ 2 Total 2   Trouble falling or staying asleep, or sleeping too much. 0   Feeling tired or having little energy 1   Poor appetite or overeating 0   Feeling bad about yourself/ that you are a failure in the past 2 weeks? 1   Trouble concentrating on things in the past 2 weeks? 1   Moving/Speaking slowly or being fidgety or restless  in the past 2 weeks? 0   Thoughts that you would be better off DEAD, or of hurting yourself in some way. 0   If you checked off any problems, how difficult have these problems made it for you to do your work, take care of things at home, or get along with other people? Somewhat difficult   PHQ 9 Total 5   Interpretation of Total Score Mild depression

## 2024-01-21 ENCOUNTER — Ambulatory Visit (HOSPITAL_BASED_OUTPATIENT_CLINIC_OR_DEPARTMENT_OTHER): Payer: Self-pay | Admitting: Physician Assistant

## 2024-01-21 DIAGNOSIS — N390 Urinary tract infection, site not specified: Secondary | ICD-10-CM

## 2024-01-21 MED ORDER — NITROFURANTOIN MONOHYDRATE/MACROCRYSTALS 100 MG CAPSULE
100.0000 mg | ORAL_CAPSULE | Freq: Two times a day (BID) | ORAL | 0 refills | Status: AC
Start: 2024-01-21 — End: 2024-01-26

## 2024-01-22 LAB — URINE CULTURE: URINE CULTURE: >100000 — AB

## 2024-01-22 LAB — CHLAMYDIA TRACHOMATIS/NEISSERIA GONORRHOEAE RNA, NAAT
CHLAMYDIA TRACHOMATIS RNA: NEGATIVE
NEISSERIA GONORRHEA GC RNA: NEGATIVE

## 2024-01-23 LAB — VAGINITIS PATHOGENS: BACTERIAL, CANDIDA, AND TV PANEL, TMA
BACTERIAL VAGINOSIS: NEGATIVE
CANDIDA GLABRATA: NEGATIVE
CANDIDA SPECIES: NEGATIVE
TRICHOMONAS VAGINALIS: NEGATIVE
